# Patient Record
Sex: Male | Born: 1965 | Race: White | Hispanic: No | Marital: Married | Smoking: Current every day smoker
Health system: Southern US, Community
[De-identification: ages and names within clinical notes are randomized; demographics above are authoritative.]

## PROBLEM LIST (undated history)

## (undated) DIAGNOSIS — T7840XA Allergy, unspecified, initial encounter: Secondary | ICD-10-CM

## (undated) DIAGNOSIS — E785 Hyperlipidemia, unspecified: Secondary | ICD-10-CM

## (undated) DIAGNOSIS — Z5189 Encounter for other specified aftercare: Secondary | ICD-10-CM

## (undated) DIAGNOSIS — K222 Esophageal obstruction: Secondary | ICD-10-CM

## (undated) DIAGNOSIS — K2270 Barrett's esophagus without dysplasia: Secondary | ICD-10-CM

## (undated) DIAGNOSIS — K635 Polyp of colon: Secondary | ICD-10-CM

## (undated) DIAGNOSIS — K5790 Diverticulosis of intestine, part unspecified, without perforation or abscess without bleeding: Secondary | ICD-10-CM

## (undated) DIAGNOSIS — K219 Gastro-esophageal reflux disease without esophagitis: Secondary | ICD-10-CM

## (undated) DIAGNOSIS — M48061 Spinal stenosis, lumbar region without neurogenic claudication: Secondary | ICD-10-CM

## (undated) DIAGNOSIS — Z8719 Personal history of other diseases of the digestive system: Secondary | ICD-10-CM

## (undated) HISTORY — DX: Spinal stenosis, lumbar region without neurogenic claudication: M48.061

## (undated) HISTORY — DX: Hyperlipidemia, unspecified: E78.5

## (undated) HISTORY — DX: Allergy, unspecified, initial encounter: T78.40XA

## (undated) HISTORY — PX: TONSILLECTOMY AND ADENOIDECTOMY: SUR1326

## (undated) HISTORY — DX: Esophageal obstruction: K22.2

## (undated) HISTORY — PX: WISDOM TOOTH EXTRACTION: SHX21

## (undated) HISTORY — DX: Diverticulosis of intestine, part unspecified, without perforation or abscess without bleeding: K57.90

## (undated) HISTORY — DX: Personal history of other diseases of the digestive system: Z87.19

## (undated) HISTORY — DX: Gastro-esophageal reflux disease without esophagitis: K21.9

## (undated) HISTORY — DX: Encounter for other specified aftercare: Z51.89

## (undated) HISTORY — DX: Polyp of colon: K63.5

## (undated) HISTORY — PX: ANKLE FRACTURE SURGERY: SHX122

## (undated) HISTORY — DX: Barrett's esophagus without dysplasia: K22.70

---

## 1993-11-13 HISTORY — PX: SPINE SURGERY: SHX786

## 1993-11-13 HISTORY — PX: CERVICAL SPINE SURGERY: SHX589

## 2002-11-13 DIAGNOSIS — Z5189 Encounter for other specified aftercare: Secondary | ICD-10-CM

## 2002-11-13 HISTORY — DX: Encounter for other specified aftercare: Z51.89

## 2002-11-13 HISTORY — PX: LUMBAR FUSION: SHX111

## 2002-11-13 HISTORY — PX: SPINE SURGERY: SHX786

## 2013-12-11 ENCOUNTER — Ambulatory Visit (INDEPENDENT_AMBULATORY_CARE_PROVIDER_SITE_OTHER): Payer: BC Managed Care – PPO | Admitting: Physical Therapy

## 2013-12-11 DIAGNOSIS — M6281 Muscle weakness (generalized): Secondary | ICD-10-CM

## 2013-12-11 DIAGNOSIS — L679 Hair color and hair shaft abnormality, unspecified: Secondary | ICD-10-CM

## 2013-12-15 ENCOUNTER — Encounter (INDEPENDENT_AMBULATORY_CARE_PROVIDER_SITE_OTHER): Payer: BC Managed Care – PPO | Admitting: Physical Therapy

## 2013-12-15 DIAGNOSIS — M545 Low back pain, unspecified: Secondary | ICD-10-CM

## 2013-12-15 DIAGNOSIS — M6281 Muscle weakness (generalized): Secondary | ICD-10-CM

## 2013-12-17 ENCOUNTER — Encounter (INDEPENDENT_AMBULATORY_CARE_PROVIDER_SITE_OTHER): Payer: Self-pay

## 2013-12-17 ENCOUNTER — Encounter (INDEPENDENT_AMBULATORY_CARE_PROVIDER_SITE_OTHER): Payer: BC Managed Care – PPO | Admitting: Physical Therapy

## 2013-12-17 DIAGNOSIS — M545 Low back pain, unspecified: Secondary | ICD-10-CM

## 2013-12-17 DIAGNOSIS — M6281 Muscle weakness (generalized): Secondary | ICD-10-CM

## 2013-12-22 ENCOUNTER — Encounter (INDEPENDENT_AMBULATORY_CARE_PROVIDER_SITE_OTHER): Payer: BC Managed Care – PPO | Admitting: Physical Therapy

## 2013-12-22 DIAGNOSIS — M545 Low back pain, unspecified: Secondary | ICD-10-CM

## 2013-12-22 DIAGNOSIS — M6281 Muscle weakness (generalized): Secondary | ICD-10-CM

## 2013-12-24 ENCOUNTER — Encounter (INDEPENDENT_AMBULATORY_CARE_PROVIDER_SITE_OTHER): Payer: BC Managed Care – PPO | Admitting: Physical Therapy

## 2013-12-24 DIAGNOSIS — M545 Low back pain, unspecified: Secondary | ICD-10-CM

## 2013-12-24 DIAGNOSIS — M47817 Spondylosis without myelopathy or radiculopathy, lumbosacral region: Secondary | ICD-10-CM

## 2013-12-24 DIAGNOSIS — M6281 Muscle weakness (generalized): Secondary | ICD-10-CM

## 2013-12-24 DIAGNOSIS — M51379 Other intervertebral disc degeneration, lumbosacral region without mention of lumbar back pain or lower extremity pain: Secondary | ICD-10-CM

## 2013-12-24 DIAGNOSIS — M5137 Other intervertebral disc degeneration, lumbosacral region: Secondary | ICD-10-CM

## 2013-12-29 ENCOUNTER — Encounter (INDEPENDENT_AMBULATORY_CARE_PROVIDER_SITE_OTHER): Payer: BC Managed Care – PPO | Admitting: Physical Therapy

## 2013-12-29 DIAGNOSIS — M6281 Muscle weakness (generalized): Secondary | ICD-10-CM

## 2013-12-29 DIAGNOSIS — M47817 Spondylosis without myelopathy or radiculopathy, lumbosacral region: Secondary | ICD-10-CM

## 2013-12-29 DIAGNOSIS — M5137 Other intervertebral disc degeneration, lumbosacral region: Secondary | ICD-10-CM

## 2013-12-29 DIAGNOSIS — M545 Low back pain, unspecified: Secondary | ICD-10-CM

## 2013-12-29 DIAGNOSIS — M51379 Other intervertebral disc degeneration, lumbosacral region without mention of lumbar back pain or lower extremity pain: Secondary | ICD-10-CM

## 2013-12-31 ENCOUNTER — Encounter: Payer: BC Managed Care – PPO | Admitting: Physical Therapy

## 2016-02-03 ENCOUNTER — Ambulatory Visit (INDEPENDENT_AMBULATORY_CARE_PROVIDER_SITE_OTHER): Payer: BLUE CROSS/BLUE SHIELD | Admitting: Family Medicine

## 2016-02-03 ENCOUNTER — Encounter: Payer: Self-pay | Admitting: Family Medicine

## 2016-02-03 VITALS — BP 125/75 | HR 82 | Temp 97.4°F | Ht 72.0 in | Wt 233.2 lb

## 2016-02-03 DIAGNOSIS — Z1211 Encounter for screening for malignant neoplasm of colon: Secondary | ICD-10-CM

## 2016-02-03 DIAGNOSIS — Z0000 Encounter for general adult medical examination without abnormal findings: Secondary | ICD-10-CM

## 2016-02-03 DIAGNOSIS — M19071 Primary osteoarthritis, right ankle and foot: Secondary | ICD-10-CM

## 2016-02-03 MED ORDER — MELOXICAM 15 MG PO TABS: 15.0000 mg | ORAL_TABLET | Freq: Every day | ORAL | Status: DC

## 2016-02-03 NOTE — Progress Notes (Signed)
BP 125/75 mmHg  Pulse 82  Temp(Src) 97.4 F (36.3 C) (Oral)  Ht 6' (1.829 m)  Wt 233 lb 3.2 oz (105.779 kg)  BMI 31.62 kg/m2   Subjective:    Patient ID: Nathaniel Mills, male    DOB: [birth year], 50 y.o.   MRN: [id number]  HPI: Nathaniel Mills is a 50 y.o. male presenting on 02/03/2016 for Annual Exam   HPI Well adult exam Patient is coming in today for well adult exam. Besides a little bit of constipation and his chronic ankle issues from an accident he has no other major issues. He denies any chest pain, shortness of breath, headaches or vision issues, abdominal complaints, diarrhea, nausea, vomiting, or other joint issues.   Arthritis of right ankle Patient is coming in as well for arthritis of the right ankle but he has had intermittently over the years. The arthritis is been persistent and he takes Advil and Tylenol intermittently and wants to know if there is something that he can take instead of having to take multiple ibuprofen and Advil throughout the day. He had a major accident when he was younger and had surgery to repair fracture in that ankle and gets the pain intermittently when he is up on his feet for prolonged periods of time.  Relevant past medical, surgical, family and social history reviewed and updated as indicated. Interim medical history since our last visit reviewed. Allergies and medications reviewed and updated.  Review of Systems  Constitutional: Negative for fever.  HENT: Negative for ear discharge and ear pain.   Eyes: Negative for discharge and visual disturbance.  Respiratory: Negative for shortness of breath and wheezing.   Cardiovascular: Negative for chest pain and leg swelling.  Gastrointestinal: Negative for abdominal pain, diarrhea and constipation.  Genitourinary: Negative for difficulty urinating.  Musculoskeletal: Positive for arthralgias. Negative for back pain, joint swelling and gait problem.  Skin: Negative for rash.  Neurological:  Negative for syncope, light-headedness and headaches.  All other systems reviewed and are negative.   Per HPI unless specifically indicated above  Social History   Social History  . Marital Status: Married    Spouse Name: N/A  . Number of Children: N/A  . Years of Education: N/A   Occupational History  . Not on file.   Social History Main Topics  . Smoking status: Current Every Day Smoker -- 1.00 packs/day for 35 years    Types: Cigars  . Smokeless tobacco: Never Used  . Alcohol Use: 3.6 oz/week    6 Cans of beer per week  . Drug Use: No  . Sexual Activity: Yes   Other Topics Concern  . Not on file   Social History Narrative  . No narrative on file    Past Surgical History  Procedure Laterality Date  . Ankle fracture surgery Left 2009 & 2011  . Spine surgery  2004    L4-6, fusion  . Spine surgery  1995    c2 facture, halo fixation    Family History  Problem Relation Age of Onset  . Heart disease Mother   . Hyperlipidemia Mother   . Hypertension Mother   . Arthritis Mother   . Alcohol abuse Mother   . Arthritis Father   . COPD Father   . Alcohol abuse Father   . Alcohol abuse Sister   . Alcohol abuse Brother   . Alcohol abuse Sister       Medication List       This list  is accurate as of: 02/03/16  4:10 PM.  Always use your most recent med list.               ibuprofen 800 MG tablet  Commonly known as:  ADVIL,MOTRIN  Take 800 mg by mouth daily with breakfast.     meloxicam 15 MG tablet  Commonly known as:  MOBIC  Take 1 tablet (15 mg total) by mouth daily.           Objective:    BP 125/75 mmHg  Pulse 82  Temp(Src) 97.4 F (36.3 C) (Oral)  Ht 6' (1.829 m)  Wt 233 lb 3.2 oz (105.779 kg)  BMI 31.62 kg/m2  Wt Readings from Last 3 Encounters:  02/03/16 233 lb 3.2 oz (105.779 kg)    Physical Exam  Constitutional: He is oriented to person, place, and time. He appears well-developed and well-nourished. No distress.  Eyes:  Conjunctivae and EOM are normal. Pupils are equal, round, and reactive to light. Right eye exhibits no discharge. No scleral icterus.  Neck: Neck supple. No thyromegaly present.  Cardiovascular: Normal rate, regular rhythm, normal heart sounds and intact distal pulses.   No murmur heard. Pulmonary/Chest: Effort normal and breath sounds normal. No respiratory distress. He has no wheezes.  Musculoskeletal: Normal range of motion. He exhibits no edema.       Right ankle: He exhibits normal range of motion, no swelling, no ecchymosis and no deformity. No tenderness (No tenderness to palpation today on exam.). Achilles tendon normal.  Lymphadenopathy:    He has no cervical adenopathy.  Neurological: He is alert and oriented to person, place, and time. Coordination normal.  Skin: Skin is warm and dry. No rash noted. He is not diaphoretic.  Psychiatric: He has a normal mood and affect. His behavior is normal.  Vitals reviewed.  No results found for this or any previous visit.    Assessment & Plan:   Problem List Items Addressed This Visit    None    Visit Diagnoses    Well adult exam    -  Primary    Relevant Orders    CMP14+EGFR (Completed)    Lipid panel (Completed)    HIV antibody (Completed)    PSA, total and free (Completed)    Arthritis of right ankle        Relevant Medications    meloxicam (MOBIC) 15 MG tablet    Screen for colon cancer        Relevant Orders    Ambulatory referral to Gastroenterology       Follow up plan: Return in about 1 year (around 02/02/2017), or if symptoms worsen or fail to improve.  Caryl Pina, MD [facility] 02/03/2016, 4:10 PM

## 2016-02-05 ENCOUNTER — Other Ambulatory Visit: Payer: BLUE CROSS/BLUE SHIELD

## 2016-02-05 DIAGNOSIS — Z0000 Encounter for general adult medical examination without abnormal findings: Secondary | ICD-10-CM

## 2016-02-06 LAB — CMP14+EGFR
A/G RATIO: 2 (ref 1.2–2.2)
ALK PHOS: 94 IU/L (ref 39–117)
ALT: 19 IU/L (ref 0–44)
AST: 18 IU/L (ref 0–40)
Albumin: 4.5 g/dL (ref 3.5–5.5)
BILIRUBIN TOTAL: 0.5 mg/dL (ref 0.0–1.2)
BUN/Creatinine Ratio: 12 (ref 9–20)
BUN: 10 mg/dL (ref 6–24)
CHLORIDE: 104 mmol/L (ref 96–106)
CO2: 23 mmol/L (ref 18–29)
Calcium: 9.2 mg/dL (ref 8.7–10.2)
Creatinine, Ser: 0.85 mg/dL (ref 0.76–1.27)
GFR calc Af Amer: 117 mL/min/{1.73_m2} (ref >59)
GFR calc non Af Amer: 102 mL/min/{1.73_m2} (ref >59)
Globulin, Total: 2.3 g/dL (ref 1.5–4.5)
Glucose: 94 mg/dL (ref 65–99)
POTASSIUM: 4.7 mmol/L (ref 3.5–5.2)
Sodium: 143 mmol/L (ref 134–144)
Total Protein: 6.8 g/dL (ref 6.0–8.5)

## 2016-02-06 LAB — PSA, TOTAL AND FREE
PSA FREE PCT: 53 %
PSA, Free: 0.53 ng/mL
Prostate Specific Ag, Serum: 1 ng/mL (ref 0.0–4.0)

## 2016-02-06 LAB — HIV ANTIBODY (ROUTINE TESTING W REFLEX): HIV SCREEN 4TH GENERATION: NONREACTIVE

## 2016-02-06 LAB — LIPID PANEL
CHOLESTEROL TOTAL: 167 mg/dL (ref 100–199)
Chol/HDL Ratio: 3.9 ratio units (ref 0.0–5.0)
HDL: 43 mg/dL (ref >39)
LDL Calculated: 110 mg/dL — ABNORMAL HIGH (ref 0–99)
TRIGLYCERIDES: 68 mg/dL (ref 0–149)
VLDL Cholesterol Cal: 14 mg/dL (ref 5–40)

## 2016-02-10 ENCOUNTER — Encounter: Payer: Self-pay | Admitting: *Deleted

## 2016-05-15 ENCOUNTER — Encounter: Payer: Self-pay | Admitting: Gastroenterology

## 2016-06-16 ENCOUNTER — Other Ambulatory Visit: Payer: Self-pay | Admitting: Family Medicine

## 2016-06-16 DIAGNOSIS — M19071 Primary osteoarthritis, right ankle and foot: Secondary | ICD-10-CM

## 2016-07-22 ENCOUNTER — Other Ambulatory Visit: Payer: Self-pay | Admitting: Family Medicine

## 2016-07-22 DIAGNOSIS — M19071 Primary osteoarthritis, right ankle and foot: Secondary | ICD-10-CM

## 2016-09-06 ENCOUNTER — Ambulatory Visit (INDEPENDENT_AMBULATORY_CARE_PROVIDER_SITE_OTHER): Payer: BLUE CROSS/BLUE SHIELD | Admitting: Family

## 2016-09-06 VITALS — BP 119/71 | HR 82 | Temp 97.6°F | Ht 72.0 in | Wt 233.8 lb

## 2016-09-06 DIAGNOSIS — J209 Acute bronchitis, unspecified: Secondary | ICD-10-CM | POA: Diagnosis not present

## 2016-09-06 DIAGNOSIS — F17200 Nicotine dependence, unspecified, uncomplicated: Secondary | ICD-10-CM

## 2016-09-06 MED ORDER — PREDNISONE 10 MG (21) PO TBPK: ORAL_TABLET | ORAL | 0 refills | Status: DC

## 2016-09-06 MED ORDER — AZITHROMYCIN 250 MG PO TABS: ORAL_TABLET | ORAL | 0 refills | Status: DC

## 2016-09-06 NOTE — Patient Instructions (Addendum)

## 2016-09-06 NOTE — Progress Notes (Signed)
Subjective:    Patient ID: Nathaniel Mills, male    DOB: 07-Sep-1966, 50 y.o.   MRN: [id number]  URI   This is a new problem. The current episode started 1 to 4 weeks ago. Associated symptoms include congestion, coughing, rhinorrhea and wheezing. Pertinent negatives include no ear pain, headaches or sore throat.  Cough  This is a new problem. The current episode started 1 to 4 weeks ago. The problem has been unchanged. The problem occurs every few minutes. The cough is productive of sputum and productive of purulent sputum. Associated symptoms include ear congestion, nasal congestion, postnasal drip, rhinorrhea and wheezing. Pertinent negatives include no chills, ear pain, headaches, myalgias, sore throat or shortness of breath. The symptoms are aggravated by lying down. Risk factors for lung disease include smoking/tobacco exposure. He has tried rest and OTC cough suppressant for the symptoms. The treatment provided moderate relief. There is no history of asthma or COPD.      Review of Systems  Constitutional: Negative for chills.  HENT: Positive for congestion, postnasal drip and rhinorrhea. Negative for ear pain and sore throat.   Respiratory: Positive for cough and wheezing. Negative for shortness of breath.   Musculoskeletal: Negative for myalgias.  Neurological: Negative for headaches.  All other systems reviewed and are negative.      Objective:   Physical Exam  Constitutional: He is oriented to person, place, and time. He appears well-developed and well-nourished. No distress.  HENT:  Head: Normocephalic.  Right Ear: External ear normal.  Left Ear: External ear normal.  Nose: Mucosal edema and rhinorrhea present. Right sinus exhibits maxillary sinus tenderness. Left sinus exhibits maxillary sinus tenderness.  Mouth/Throat: Posterior oropharyngeal erythema present.  Eyes: Pupils are equal, round, and reactive to light. Right eye exhibits no discharge. Left eye exhibits no  discharge.  Neck: Normal range of motion. Neck supple. No thyromegaly present.  Cardiovascular: Normal rate, regular rhythm, normal heart sounds and intact distal pulses.   No murmur heard. Pulmonary/Chest: Effort normal. No respiratory distress. He has no wheezes. He has rhonchi in the right lower field and the left lower field.  Abdominal: Soft. Bowel sounds are normal. He exhibits no distension. There is no tenderness.  Musculoskeletal: Normal range of motion. He exhibits no edema or tenderness.  Neurological: He is alert and oriented to person, place, and time. He has normal reflexes. No cranial nerve deficit.  Skin: Skin is warm and dry. No rash noted. No erythema.  Psychiatric: He has a normal mood and affect. His behavior is normal. Judgment and thought content normal.  Vitals reviewed.     BP 119/71 (BP Location: Left Arm, Patient Position: Sitting, Cuff Size: Large)   Pulse 82   Temp 97.6 F (36.4 C) (Oral)   Ht 6' (1.829 m)   Wt 233 lb 12.8 oz (106.1 kg)   BMI 31.71 kg/m      Assessment & Plan:  1. Acute bronchitis, unspecified organism -- Take meds as prescribed - Use a cool mist humidifier  -Use saline nose sprays frequently -Saline irrigations of the nose can be very helpful if done frequently.  * 4X daily for 1 week*  * Use of a nettie pot can be helpful with this. Follow directions with this* -Force fluids -For any cough or congestion  Use plain Mucinex- regular strength or max strength is fine   * Children- consult with Pharmacist for dosing -For fever or aces or pains- take tylenol or ibuprofen appropriate for age and  weight.  * for fevers greater than 101 orally you may alternate ibuprofen and tylenol every  3 hours. -Throat lozenges if help - azithromycin (ZITHROMAX) 250 MG tablet; Take 500 mg once, then 250 mg for four days  Dispense: 6 tablet; Refill: 0 - predniSONE (STERAPRED UNI-PAK 21 TAB) 10 MG (21) TBPK tablet; Use as directed  Dispense: 21 tablet;  Refill: 0  2. Current smoker - azithromycin (ZITHROMAX) 250 MG tablet; Take 500 mg once, then 250 mg for four days  Dispense: 6 tablet; Refill: 0  Evelina Dun, FNP

## 2016-09-12 ENCOUNTER — Ambulatory Visit (INDEPENDENT_AMBULATORY_CARE_PROVIDER_SITE_OTHER): Payer: BLUE CROSS/BLUE SHIELD | Admitting: Nurse Practitioner

## 2016-09-12 ENCOUNTER — Encounter: Payer: Self-pay | Admitting: Nurse Practitioner

## 2016-09-12 VITALS — BP 128/78 | HR 84 | Temp 97.0°F | Ht 72.0 in | Wt 236.0 lb

## 2016-09-12 DIAGNOSIS — M5441 Lumbago with sciatica, right side: Secondary | ICD-10-CM | POA: Diagnosis not present

## 2016-09-12 DIAGNOSIS — M5442 Lumbago with sciatica, left side: Secondary | ICD-10-CM

## 2016-09-12 DIAGNOSIS — G8929 Other chronic pain: Secondary | ICD-10-CM | POA: Diagnosis not present

## 2016-09-12 NOTE — Progress Notes (Signed)
   Subjective:    Patient ID: Nathaniel Mills, male    DOB: May 10, 1966, 50 y.o.   MRN: [id number]  HPI  Patient in today saying back pain is no better. He was seen 09/06/16 and was diagnosed with  Bronchitis and was given steroid shot and zithromax. He says he was c/o back pain that day but there is nothing in the chart about his back pain. He said he had spinal fusion 12 years ago and he has frequent bouts of pain with numbness and tingling down legs. Rates pain 5/10 currently. Pain is intermittent and movement increases pain. He denies any recent injuries or strains. Says he is starting to loose strength in his legs.    Review of Systems  Constitutional: Negative.   HENT: Negative.   Respiratory: Negative.   Gastrointestinal: Negative.   Musculoskeletal: Positive for back pain and gait problem.  Psychiatric/Behavioral: Negative.   All other systems reviewed and are negative.      Objective:   Physical Exam  Constitutional: He is oriented to person, place, and time. He appears well-developed and well-nourished. No distress.  HENT:  Head: Normocephalic.  Cardiovascular: Normal rate, regular rhythm and normal heart sounds.   Pulmonary/Chest: Effort normal and breath sounds normal.  Musculoskeletal:  FROM of lumbar spine painful with rotation in either direction and full flexion. (-SLR ) Motor strength and sensation distally intact and equal bil Pain in lower back on palpation.  Neurological: He is alert and oriented to person, place, and time. He has normal reflexes.  Skin: Skin is warm and dry.  Psychiatric: He has a normal mood and affect. His behavior is normal. Judgment and thought content normal.    BP 128/78   Pulse 84   Temp 97 F (36.1 C) (Oral)   Ht 6' (1.829 m)   Wt 236 lb (107 kg)   BMI 32.01 kg/m       Assessment & Plan:  1. Chronic midline low back pain with bilateral sciatica Moist heat Rest mobic Back strengthing exercises- until sees dr. Nelva Bush -  Ambulatory referral to [place], [place]

## 2016-09-12 NOTE — Patient Instructions (Signed)

## 2016-10-09 ENCOUNTER — Other Ambulatory Visit: Payer: Self-pay | Admitting: Orthopedic Surgery

## 2016-10-09 DIAGNOSIS — G8929 Other chronic pain: Secondary | ICD-10-CM

## 2016-10-09 DIAGNOSIS — M5442 Lumbago with sciatica, left side: Principal | ICD-10-CM

## 2016-10-27 ENCOUNTER — Ambulatory Visit
Admission: RE | Admit: 2016-10-27 | Discharge: 2016-10-27 | Disposition: A | Discharge status: Home / Self Care | Payer: BLUE CROSS/BLUE SHIELD | Source: Ambulatory Visit | Attending: Orthopedic Surgery | Admitting: Orthopedic Surgery

## 2016-10-27 DIAGNOSIS — M5442 Lumbago with sciatica, left side: Principal | ICD-10-CM

## 2016-10-27 DIAGNOSIS — G8929 Other chronic pain: Secondary | ICD-10-CM

## 2016-10-27 MED ORDER — DIAZEPAM 5 MG PO TABS
10.0000 mg | ORAL_TABLET | Freq: Once | ORAL | Status: AC
  Administered 2016-10-27: 10 mg via ORAL

## 2016-10-27 MED ORDER — ONDANSETRON HCL 4 MG/2ML IJ SOLN: 4.0000 mg | Freq: Four times a day (QID) | INTRAMUSCULAR | Status: DC | PRN

## 2016-10-27 MED ORDER — IOPAMIDOL (ISOVUE-M 200) INJECTION 41%
15.0000 mL | Freq: Once | INTRAMUSCULAR | Status: AC
  Administered 2016-10-27: 15 mL via INTRATHECAL

## 2016-10-27 NOTE — Progress Notes (Signed)
Patient states he has been off Tramadol for at least the past two days.  jkl 

## 2016-10-27 NOTE — Discharge Instructions (Signed)
Myelogram Discharge Instructions  1. Go home and rest quietly for the next 24 hours.  It is important to lie flat for the next 24 hours.  Get up only to go to the restroom.  You may lie in the bed or on a couch on your back, your stomach, your left side or your right side.  You may have one pillow under your head.  You may have pillows between your knees while you are on your side or under your knees while you are on your back.  2. DO NOT drive today.  Recline the seat as far back as it will go, while still wearing your seat belt, on the way home.  3. You may get up to go to the bathroom as needed.  You may sit up for 10 minutes to eat.  You may resume your normal diet and medications unless otherwise indicated.  Drink lots of extra fluids today and tomorrow.  4. The incidence of headache, nausea, or vomiting is about 5% (one in 20 patients).  If you develop a headache, lie flat and drink plenty of fluids until the headache goes away.  Caffeinated beverages may be helpful.  If you develop severe nausea and vomiting or a headache that does not go away with flat bed rest, call [phone].  5. You may resume normal activities after your 24 hours of bed rest is over; however, do not exert yourself strongly or do any heavy lifting tomorrow. If when you get up you have a headache when standing, go back to bed and force fluids for another 24 hours.  6. Call your physician for a follow-up appointment.  The results of your myelogram will be sent directly to your physician by the following day.  7. If you have any questions or if complications develop after you arrive home, please call [phone].  Discharge instructions have been explained to the patient.  The patient, or the person responsible for the patient, fully understands these instructions.       May resume Tramadol on Dec. 16, 2017, after 10:30 am.

## 2016-10-30 ENCOUNTER — Telehealth: Payer: Self-pay

## 2016-10-30 NOTE — Telephone Encounter (Signed)
Spoke with patient's wife after he had a myelogram here 10/27/16.  She states he is doing fine and did not get a headache.  jkl

## 2016-11-15 ENCOUNTER — Ambulatory Visit: Payer: BLUE CROSS/BLUE SHIELD | Attending: Orthopedic Surgery | Admitting: Physical Therapy

## 2016-11-15 ENCOUNTER — Encounter: Payer: Self-pay | Admitting: Physical Therapy

## 2016-11-15 DIAGNOSIS — G8929 Other chronic pain: Secondary | ICD-10-CM | POA: Diagnosis present

## 2016-11-15 DIAGNOSIS — M5441 Lumbago with sciatica, right side: Secondary | ICD-10-CM | POA: Diagnosis present

## 2016-11-15 DIAGNOSIS — M6281 Muscle weakness (generalized): Secondary | ICD-10-CM

## 2016-11-15 DIAGNOSIS — M5442 Lumbago with sciatica, left side: Secondary | ICD-10-CM | POA: Insufficient documentation

## 2016-11-15 NOTE — Therapy (Signed)
[facility] [place], [place], [address] Phone: [phone]   Fax:  [phone]  Physical Therapy Evaluation  Patient Details  Name: Nathaniel Mills MRN: [id number] Date of Birth: June 14, 1966 Referring Provider: Melina Schools, MD  Encounter Date: 11/15/2016      PT End of Session - 11/15/16 [id number]    Visit Number 1   Number of Visits 12   Date for PT Re-Evaluation 12/27/16   PT Start Time 0823   PT Stop Time 0921   PT Time Calculation (min) 58 min   Activity Tolerance Patient tolerated treatment well   Behavior During Therapy [facility] for tasks assessed/performed      Past Medical History:  Diagnosis Date  . Allergy   . Arthritis   . Blood transfusion without reported diagnosis 2004  . Depression 2004  . GERD (gastroesophageal reflux disease) 2004-2012  . Hyperlipidemia     Past Surgical History:  Procedure Laterality Date  . ANKLE FRACTURE SURGERY Left 2009 & 2011  . SPINE SURGERY  2004   L4-6, fusion  . SPINE SURGERY  1995   c2 facture, halo fixation    There were no vitals filed for this visit.       Subjective Assessment - 11/15/16 0824    Subjective Patient reports he did really well for about two years after surgery in 2004. Ever since then he has had B leg pain and weakness. Weakness is worse in left. He has difficulty with inclines and stairs. He only has 3 stairs at home.   Pertinent History L4-5 fusion   Patient Stated Goals decrease pain and improve strength   Currently in Pain? Yes   Pain Score 6    Pain Location Back   Pain Orientation Left;Right  L > R   Pain Descriptors / Indicators Burning;Sharp   Pain Type Chronic pain   Pain Radiating Towards posterior thighs to knees L>R   Pain Onset More than a month ago   Pain Frequency Constant   Aggravating Factors  slouching, sleeping wrong?, lack of lumbar support   Pain Relieving Factors poor body mechanics   Effect of Pain on Daily Activities  limited            [facility] PT Assessment - 11/15/16 0001      Assessment   Medical Diagnosis chronic B LBP with B sciatica   Referring Provider Melina Schools, MD   Onset Date/Surgical Date 11/13/01     Precautions   Precaution Comments L4-5 fusion     Balance Screen   Has the patient fallen in the past 6 months No   Has the patient had a decrease in activity level because of a fear of falling?  Yes   Is the patient reluctant to leave their home because of a fear of falling?  No     Home Environment   Additional Comments one level home     Prior Function   Vocation Full time employment   Vocation Requirements sitting at desk, walking on concrete floor     Posture/Postural Control   Posture Comments depressed left shoulder and scapula; tight L QL     ROM / Strength   AROM / PROM / Strength AROM;Strength     AROM   Overall AROM Comments Lumbar flex <90 deg; RSB decreased 25%; L rotation decreased 25%     Strength   Overall Strength Comments RLE 5/5 except R knee flex and hip ABD 4-/5; L hip flex  4/5, knee flex 4-/5,      Flexibility   Soft Tissue Assessment /Muscle Length yes   Hamstrings R approx 50 deg; L 60 deg   Quadriceps B tightness; L HF tightness   Piriformis mod tightness B     Palpation   Palpation comment B lumbar paraspinals, L QL                    [facility] Adult PT Treatment/Exercise - 11/15/16 0001      Modalities   Modalities Electrical Stimulation;Moist Heat     Moist Heat Therapy   Number Minutes Moist Heat 15 Minutes   Moist Heat Location Lumbar Spine     Electrical Stimulation   Electrical Stimulation Location premod to lumbar 80-150Hz  x 15 min   Electrical Stimulation Goals Pain                PT Education - 11/15/16 1216    Education provided Yes   Education Details HEP   Person(s) Educated Patient   Methods Explanation;Demonstration;Handout   Comprehension Returned demonstration;Verbalized understanding              PT Long Term Goals - 11/15/16 1218      PT LONG TERM GOAL #1   Title I with HEP   Time 6   Period Weeks   Status New     PT LONG TERM GOAL #2   Title Decreased pain with ADLS by [id number]   Time 6   Period Weeks   Status New     PT LONG TERM GOAL #3   Title Improved BLE strength to 5/5.   Time 6   Period Weeks   Status New     PT LONG TERM GOAL #4   Title Patient to be able to climb stairs recipocally with UE support for balance only   Time 6   Period Weeks   Status New               Plan - 11/15/16 0910    Clinical Impression Statement Patient presents with long h/o LBP due to stenosis. He has limited ROM, impaired flexibility and weakness all affecting ADLs including stair climbing.    Rehab Potential Good   PT Frequency 2x / week   PT Duration 8 weeks   PT Treatment/Interventions ADLs/Self Care Home Management;Electrical Stimulation;Cryotherapy;Moist Heat;Ultrasound;Therapeutic exercise;Patient/family education;Manual techniques;Dry needling   PT Next Visit Plan Flexibility, core strengthening, functional strengthening (stairs, squats, lunges)   PT Home Exercise Plan stretches: hip flexor, HS, quads, piriformis, calf   Consulted and Agree with Plan of Care Patient      Patient will benefit from skilled therapeutic intervention in order to improve the following deficits and impairments:  Decreased range of motion, Pain, Impaired flexibility, Decreased strength  Visit Diagnosis: Chronic bilateral low back pain with bilateral sciatica - Plan: PT plan of care cert/re-cert  Muscle weakness (generalized) - Plan: PT plan of care cert/re-cert     Problem List There are no active problems to display for this patient.   Madelyn Flavors PT 11/15/2016, 12:25 PM  [facility] [place], [place], [address] Phone: [phone]   Fax:  [phone]  Name: Detric Shaheen MRN: [id number] Date of Birth:  04-13-1966

## 2016-11-15 NOTE — Patient Instructions (Signed)
  Hamstring Stretch, Reclined (Strap, Doorframe)   Lengthen bottom leg on floor. Extend top leg along edge of doorframe or press foot up into yoga strap. Hold for 60 seconds. Repeat 3_ times each leg.  Quadriceps (Prone)   On stomach with sheet around ankles, knees together, hips down, pull heels toward bottom. Keep hips flat. Hold __60__ seconds. Repeat _3__ times. Do __3__ sessions per day. CAUTION: Stretch should be gentle, steady and slow.   Achilles Tendon Stretch   Stand with hands supported on wall, elbows slightly bent, feet parallel and both heels on floor, front knee bent, back knee straight. Slowly relax back knee until a stretch is felt in achilles tendon. Hold __30_ seconds. Repeat with leg positions switched. Repeat with back knee slightly bent.  Hip Stretch  Put right ankle over left knee. Let right knee fall downward, but keep ankle in place. Feel the stretch in hip. May push down gently with hand to feel stretch. Hold _60___ seconds while counting out loud. Repeat with other leg. Repeat __3__ times. Do ___3_ sessions per day.  Stretching: Piriformis (Supine)  Pull right knee toward opposite shoulder. Hold 60____ seconds. Relax. Repeat ____ times per set. Do ____ sets per session. Do ____ sessions per day.   Leg Extension (Hamstring)   Sit toward front edge of chair, with leg out straight, heel on floor, toes pointing toward body. Keeping back straight, bend forward at hip, until a stretch is felt. Hold 30-60 seconds. Repeat _3__ times. Repeat with other leg. Do __2-3_ sessions per day.  Nathaniel Mills, PT 11/15/16 8:57 AM [facility] [address] [place], [place], [address] Phone: [phone]   Fax:  [phone]

## 2016-11-16 ENCOUNTER — Ambulatory Visit: Payer: BLUE CROSS/BLUE SHIELD | Admitting: Physical Therapy

## 2016-11-21 ENCOUNTER — Encounter: Payer: BLUE CROSS/BLUE SHIELD | Admitting: Physical Therapy

## 2016-11-23 ENCOUNTER — Encounter: Payer: BLUE CROSS/BLUE SHIELD | Admitting: Physical Therapy

## 2016-11-30 ENCOUNTER — Encounter: Payer: BLUE CROSS/BLUE SHIELD | Admitting: Physical Therapy

## 2016-12-01 ENCOUNTER — Telehealth: Payer: Self-pay | Admitting: Family Medicine

## 2016-12-01 NOTE — Telephone Encounter (Signed)
Wife states that they get this From Dr Rolena Infante - ortho - not here

## 2016-12-01 NOTE — Telephone Encounter (Signed)
I have only seen him once and that was March 2017 so if he wants to come in to discuss something come in for a visit.

## 2016-12-05 ENCOUNTER — Ambulatory Visit: Payer: BLUE CROSS/BLUE SHIELD | Admitting: Physical Therapy

## 2016-12-05 ENCOUNTER — Encounter: Payer: Self-pay | Admitting: Physical Therapy

## 2016-12-05 DIAGNOSIS — G8929 Other chronic pain: Secondary | ICD-10-CM

## 2016-12-05 DIAGNOSIS — M5442 Lumbago with sciatica, left side: Principal | ICD-10-CM

## 2016-12-05 DIAGNOSIS — M5441 Lumbago with sciatica, right side: Principal | ICD-10-CM

## 2016-12-05 DIAGNOSIS — M6281 Muscle weakness (generalized): Secondary | ICD-10-CM

## 2016-12-05 NOTE — Patient Instructions (Addendum)
Pelvic Tilt    Flatten back by tightening stomach muscles and buttocks. Hold for 5 seconds each. Repeat __10__ times per set. Do _2___ sets per session. Do __2-3__ sessions per day.  http://orth.exer.us/134   Copyright  VHI. All rights reserved.  Bent Leg Lift (Hook-Lying)    Tighten stomach and slowly raise right/left leg __3__ inches from floor. Keep trunk rigid.  Repeat __10__ times per set. Do __2__ sets per session. Do __2-3__ sessions per day.  http://orth.exer.us/1090   Copyright  VHI. All rights reserved.  Bridging    Slowly raise buttocks from floor, keeping stomach tight. Repeat __10__ times per set. Do _2___ sets per session. Do _2-3___ sessions per day.  http://orth.exer.us/1096   Copyright  VHI. All rights reserved.  Straight Leg Raise    Tighten stomach and slowly raise locked right/left leg _5___ inches from floor. Repeat __10__ times per set. Do __2__ sets per session. Do __2-3__ sessions per day.  http://orth.exer.us/1102   Copyright  VHI. All rights reserved.

## 2016-12-05 NOTE — Therapy (Signed)
[facility] [place], [place], [address] Phone: [phone]   Fax:  [phone]  Physical Therapy Treatment  Patient Details  Name: Nathaniel Mills MRN: [id number] Date of Birth: 11-Jul-1966 Referring Provider: Melina Schools, MD  Encounter Date: 12/05/2016      PT End of Session - 12/05/16 1659    Visit Number 2   Number of Visits 12   Date for PT Re-Evaluation 12/27/16   PT Start Time [id number]   PT Stop Time 1731   PT Time Calculation (min) 44 min   Activity Tolerance Patient tolerated treatment well   Behavior During Therapy [facility] for tasks assessed/performed      Past Medical History:  Diagnosis Date  . Allergy   . Arthritis   . Blood transfusion without reported diagnosis 2004  . Depression 2004  . GERD (gastroesophageal reflux disease) 2004-2012  . Hyperlipidemia     Past Surgical History:  Procedure Laterality Date  . ANKLE FRACTURE SURGERY Left 2009 & 2011  . SPINE SURGERY  2004   L4-6, fusion  . SPINE SURGERY  1995   c2 facture, halo fixation    There were no vitals filed for this visit.      Subjective Assessment - 12/05/16 1647    Subjective Reports that he is behind as he had a death in the family right after evaluation. Reports more stiffness than pain and weakness in LE. Reports that he was unable    Pertinent History L4-5 fusion   Patient Stated Goals decrease pain and improve strength   Currently in Pain? Yes   Pain Score 2    Pain Location Back   Pain Orientation Lower   Pain Descriptors / Indicators Other (Comment)  Stiffness   Pain Type Chronic pain   Pain Onset More than a month ago   Aggravating Factors  Slouching, sleeping wrong, lack of lumbar support   Pain Relieving Factors Changing positions            [facility] PT Assessment - 12/05/16 0001      Assessment   Medical Diagnosis chronic B LBP with B sciatica   Onset Date/Surgical Date 11/13/01   Next MD Visit None scheduled     Precautions   Precaution Comments L4-5 fusion                     [facility] Adult PT Treatment/Exercise - 12/05/16 0001      Exercises   Exercises Lumbar     Lumbar Exercises: Stretches   Active Hamstring Stretch 3 reps;30 seconds   Active Hamstring Stretch Limitations BLE   Single Knee to Chest Stretch 3 reps;30 seconds   Single Knee to Chest Stretch Limitations BLE   Piriformis Stretch 3 reps;30 seconds   Piriformis Stretch Limitations BLE     Lumbar Exercises: Supine   Ab Set 20 reps;5 seconds   Clam 20 reps   Clam Limitations Red theraband   Heel Slides 15 reps   Heel Slides Limitations BLE   Bent Knee Raise 20 reps   Bent Knee Raise Limitations BLE   Bridge 15 reps   Straight Leg Raise 15 reps   Straight Leg Raises Limitations BLE     Modalities   Modalities Electrical Stimulation;Moist Heat     Moist Heat Therapy   Number Minutes Moist Heat 15 Minutes   Moist Heat Location Lumbar Spine     Electrical Stimulation   Electrical Stimulation Location B lumbar paraspinals  Electrical Stimulation Action Pre-Mod   Electrical Stimulation Parameters 80-150 hz x15 min   Electrical Stimulation Goals Pain                PT Education - 12/05/16 1725    Education provided Yes   Education Details HEP- ab set, march, bridge, [employer]   Person(s) Educated Patient   Methods Explanation;Verbal cues;Handout   Comprehension Verbalized understanding;Verbal cues required             PT Long Term Goals - 11/15/16 1218      PT LONG TERM GOAL #1   Title I with HEP   Time 6   Period Weeks   Status New     PT LONG TERM GOAL #2   Title Decreased pain with ADLS by [id number]   Time 6   Period Weeks   Status New     PT LONG TERM GOAL #3   Title Improved BLE strength to 5/5.   Time 6   Period Weeks   Status New     PT LONG TERM GOAL #4   Title Patient to be able to climb stairs recipocally with UE support for balance only   Time 6   Period Weeks    Status New               Plan - 12/05/16 1727    Clinical Impression Statement Patient presented in clinic with reports of low back stiffness and report of noncompliance with initial HEP secondary to being out of town due to death in the family. Patient very compliant with instruction and exercises today and able to complete with no reports of any increased pain with core strengthening exercises. Patient experienced more tightness and weakness in LLE per patient report. Patient provided new core/LE strengthening HEP and encouraged to add with stretching HEP provided at evaluation. Normal modalities response noted following removal of the modalities. Patient noted low back feeling "good" upon sitting after exercises.   Rehab Potential Good   PT Frequency 2x / week   PT Duration 8 weeks   PT Treatment/Interventions ADLs/Self Care Home Management;Electrical Stimulation;Cryotherapy;Moist Heat;Ultrasound;Therapeutic exercise;Patient/family education;Manual techniques;Dry needling   PT Next Visit Plan Flexibility, core strengthening, functional strengthening (stairs, squats, lunges)   PT Home Exercise Plan stretches: hip flexor, HS, quads, piriformis, calf; ab set, marching, bridge, SLR   Consulted and Agree with Plan of Care Patient      Patient will benefit from skilled therapeutic intervention in order to improve the following deficits and impairments:  Decreased range of motion, Pain, Impaired flexibility, Decreased strength  Visit Diagnosis: Chronic bilateral low back pain with bilateral sciatica  Muscle weakness (generalized)     Problem List There are no active problems to display for this patient.   Wynelle Fanny, PTA 12/05/2016, 5:40 PM  [facility] [address] [place], [place], [address] Phone: [phone]   Fax:  [phone]  Name: Nathaniel Mills MRN: [id number] Date of Birth: 1966-10-28

## 2016-12-07 ENCOUNTER — Ambulatory Visit: Payer: BLUE CROSS/BLUE SHIELD | Admitting: Physical Therapy

## 2016-12-07 ENCOUNTER — Encounter: Payer: Self-pay | Admitting: Physical Therapy

## 2016-12-07 DIAGNOSIS — G8929 Other chronic pain: Secondary | ICD-10-CM

## 2016-12-07 DIAGNOSIS — M5441 Lumbago with sciatica, right side: Principal | ICD-10-CM

## 2016-12-07 DIAGNOSIS — M5442 Lumbago with sciatica, left side: Secondary | ICD-10-CM | POA: Diagnosis not present

## 2016-12-07 DIAGNOSIS — M6281 Muscle weakness (generalized): Secondary | ICD-10-CM

## 2016-12-07 NOTE — Therapy (Signed)
[facility] [place], [place], [address] Phone: [phone]   Fax:  [phone]  Physical Therapy Treatment  Patient Details  Name: Nathaniel Mills MRN: [id number] Date of Birth: Dec 14, 1965 Referring Provider: Melina Schools, MD  Encounter Date: 12/07/2016      PT End of Session - 12/07/16 1653    Visit Number 3   Number of Visits 12   Date for PT Re-Evaluation 12/27/16   PT Start Time [id number]   PT Stop Time 1739   PT Time Calculation (min) 51 min   Activity Tolerance Patient tolerated treatment well   Behavior During Therapy [facility] for tasks assessed/performed      Past Medical History:  Diagnosis Date  . Allergy   . Arthritis   . Blood transfusion without reported diagnosis 2004  . Depression 2004  . GERD (gastroesophageal reflux disease) 2004-2012  . Hyperlipidemia     Past Surgical History:  Procedure Laterality Date  . ANKLE FRACTURE SURGERY Left 2009 & 2011  . SPINE SURGERY  2004   L4-6, fusion  . SPINE SURGERY  1995   c2 facture, halo fixation    There were no vitals filed for this visit.      Subjective Assessment - 12/07/16 1651    Subjective Reports that he is having a good day with not a lot of pain and pep in his step. Continues to have some stiffness in low back.   Pertinent History L4-5 fusion   Patient Stated Goals decrease pain and improve strength   Currently in Pain? Yes   Pain Score 3    Pain Location Back   Pain Orientation Lower   Pain Descriptors / Indicators Other (Comment)  "stiffness"   Pain Type Chronic pain   Pain Onset More than a month ago            [facility] PT Assessment - 12/07/16 0001      Assessment   Medical Diagnosis chronic B LBP with B sciatica   Onset Date/Surgical Date 11/13/01   Next MD Visit None scheduled     Precautions   Precaution Comments L4-5 fusion                     [facility] Adult PT Treatment/Exercise - 12/07/16 0001      Lumbar  Exercises: Stretches   Active Hamstring Stretch 3 reps;30 seconds   Active Hamstring Stretch Limitations BLE with opp LE extension as to stretch hip flexor as well   Single Knee to Chest Stretch 3 reps;30 seconds   Single Knee to Chest Stretch Limitations BLE   Piriformis Stretch 3 reps;30 seconds   Piriformis Stretch Limitations BLE     Lumbar Exercises: Machines for Strengthening   Cybex Knee Extension 10# 3x10 reps  with core activation   Cybex Knee Flexion 30# 3x10 reps  with core activation     Lumbar Exercises: Standing   Other Standing Lumbar Exercises B forward step ups 6" x20 reps each with one UE support     Lumbar Exercises: Supine   Ab Set 20 reps;5 seconds   Clam 20 reps   Clam Limitations Red theraband   Bent Knee Raise 20 reps   Bent Knee Raise Limitations BLE   Bridge 20 reps;3 seconds   Straight Leg Raise 20 reps   Straight Leg Raises Limitations BLE   Other Supine Lumbar Exercises B leg press with green theraband and core activation x20 reps each   Other  Supine Lumbar Exercises Dead bug x15 reps     Modalities   Modalities Electrical Stimulation;Moist Heat     Moist Heat Therapy   Number Minutes Moist Heat 15 Minutes   Moist Heat Location Lumbar Spine     Electrical Stimulation   Electrical Stimulation Location B lumbar paraspinals   Electrical Stimulation Action Pre-Mod   Electrical Stimulation Parameters 80-150 hz x15 min   Electrical Stimulation Goals Pain                     PT Long Term Goals - 12/07/16 1654      PT LONG TERM GOAL #1   Title I with HEP   Time 6   Period Weeks   Status Achieved     PT LONG TERM GOAL #2   Title Decreased pain with ADLS by [id number]   Time 6   Period Weeks   Status On-going     PT LONG TERM GOAL #3   Title Improved BLE strength to 5/5.   Time 6   Period Weeks   Status On-going     PT LONG TERM GOAL #4   Title Patient to be able to climb stairs recipocally with UE support for balance only    Time 6   Period Weeks   Status On-going               Plan - 12/07/16 1742    Clinical Impression Statement Patient tolerated today's treatment very well and arrived with low level low back stiffness. Patient reports compliance with HEP at home. Patient able to complete core/LE strengthening exercises with no reports of any increased pain. Patient experienced increased tightness in L HS today when compared to RLE. Standing and machine LE strengthening completed today with patient experiencing greater weakness in LLE. Normal modalities response noted following removal of the modalities.   Rehab Potential Good   PT Frequency 2x / week   PT Duration 8 weeks   PT Treatment/Interventions ADLs/Self Care Home Management;Electrical Stimulation;Cryotherapy;Moist Heat;Ultrasound;Therapeutic exercise;Patient/family education;Manual techniques;Dry needling   PT Next Visit Plan Flexibility, core strengthening, functional strengthening (stairs, squats, lunges)   PT Home Exercise Plan stretches: hip flexor, HS, quads, piriformis, calf; ab set, marching, bridge, SLR   Consulted and Agree with Plan of Care Patient      Patient will benefit from skilled therapeutic intervention in order to improve the following deficits and impairments:  Decreased range of motion, Pain, Impaired flexibility, Decreased strength  Visit Diagnosis: Chronic bilateral low back pain with bilateral sciatica  Muscle weakness (generalized)     Problem List There are no active problems to display for this patient.   Wynelle Fanny, PTA 12/07/2016, 5:49 PM  [facility] [address] [place], [place], [address] Phone: [phone]   Fax:  [phone]  Name: Nathaniel Mills MRN: [id number] Date of Birth: [birth year]

## 2016-12-12 ENCOUNTER — Encounter: Payer: BLUE CROSS/BLUE SHIELD | Admitting: Physical Therapy

## 2016-12-12 ENCOUNTER — Ambulatory Visit: Payer: BLUE CROSS/BLUE SHIELD | Admitting: Physical Therapy

## 2016-12-12 DIAGNOSIS — M5442 Lumbago with sciatica, left side: Secondary | ICD-10-CM | POA: Diagnosis not present

## 2016-12-12 DIAGNOSIS — G8929 Other chronic pain: Secondary | ICD-10-CM

## 2016-12-12 DIAGNOSIS — M5441 Lumbago with sciatica, right side: Principal | ICD-10-CM

## 2016-12-12 NOTE — Therapy (Signed)
[facility] [place], [place], [address] Phone: [phone]   Fax:  [phone]  Physical Therapy Treatment  Patient Details  Name: Nathaniel Mills MRN: [id number] Date of Birth: 02-25-1966 Referring Provider: Melina Schools, MD  Encounter Date: 12/12/2016      PT End of Session - 12/12/16 0818    Visit Number 4   Number of Visits 12   Date for PT Re-Evaluation 12/27/16   PT Start Time 0818   PT Stop Time 0920   PT Time Calculation (min) 62 min      Past Medical History:  Diagnosis Date  . Allergy   . Arthritis   . Blood transfusion without reported diagnosis 2004  . Depression 2004  . GERD (gastroesophageal reflux disease) 2004-2012  . Hyperlipidemia     Past Surgical History:  Procedure Laterality Date  . ANKLE FRACTURE SURGERY Left 2009 & 2011  . SPINE SURGERY  2004   L4-6, fusion  . SPINE SURGERY  1995   c2 facture, halo fixation    There were no vitals filed for this visit.      Subjective Assessment - 12/12/16 0818    Subjective Patient presents with TENS unit today for help setting it up. Overall patient reports improved mobility and decreased pain.    Pertinent History L4-5 fusion   Patient Stated Goals decrease pain and improve strength   Currently in Pain? Yes   Pain Score 2    Pain Location Back   Pain Orientation Lower   Pain Descriptors / Indicators --  stiffness   Pain Type Chronic pain   Pain Radiating Towards posterior thighs  to knees L >R intermittent   Pain Onset More than a month ago   Pain Frequency Constant   Aggravating Factors  stretching, poor mechanics   Pain Relieving Factors changing positios                         [facility] Adult PT Treatment/Exercise - 12/12/16 0001      Self-Care   Self-Care Other Self-Care Comments   Other Self-Care Comments  Educated patient in use of home TENS unit including precautions and contraindications; set up and removal;  patient independent in donning/doffing and use.     Modalities   Modalities Electrical Stimulation     Moist Heat Therapy   Number Minutes Moist Heat 15 Minutes   Moist Heat Location Lumbar Spine     Electrical Stimulation   Electrical Stimulation Location B lumbar/gluteals   Electrical Stimulation Action Premod   Electrical Stimulation Parameters 80-150hz  mod x 15 min   Electrical Stimulation Goals Pain     Manual Therapy   Manual Therapy Soft tissue mobilization   Soft tissue mobilization to B gluteals/piriformis and lumbar paraspinals          Trigger Point Dry Needling - 12/12/16 1014    Consent Given? Yes   Education Handout Provided Yes   Muscles Treated Upper Body Quadratus Lumborum  B   Muscles Treated Lower Body Gluteus minimus;Gluteus maximus;Piriformis  B; and L3-5 multifidi   Gluteus Maximus Response Twitch response elicited;Palpable increased muscle length   Gluteus Minimus Response Twitch response elicited;Palpable increased muscle length   Piriformis Response Twitch response elicited;Palpable increased muscle length                   PT Long Term Goals - 12/12/16 [id number]      PT LONG TERM GOAL #  1   Title I with HEP   Time 6   Period Weeks   Status Achieved     PT LONG TERM GOAL #2   Title Decreased pain with ADLS by [id number]   Time 6   Period Weeks   Status Achieved     PT LONG TERM GOAL #3   Title Improved BLE strength to 5/5.   Time 6   Period Weeks   Status On-going     PT LONG TERM GOAL #4   Title Patient to be able to climb stairs recipocally with UE support for balance only   Time 6   Period Weeks   Status On-going               Plan - 12/12/16 1018    Clinical Impression Statement Patient reports 50% improvement overall in pain and reports increased mobility and function. He continues to have pain in his low back L>R and intermitent referral into B posterior thighs. He responded well to DN with localized twitch responses  elicited L >R. Additionally, patient was educated in use of his home TENS unit and is I with its use.   Rehab Potential Good   PT Frequency 2x / week   PT Duration 8 weeks   PT Treatment/Interventions ADLs/Self Care Home Management;Electrical Stimulation;Cryotherapy;Moist Heat;Ultrasound;Therapeutic exercise;Patient/family education;Manual techniques;Dry needling   PT Next Visit Plan Assess DN; continue as indicated. Flexibility, core strengthening, functional strengthening (stairs, squats, lunges)   PT Home Exercise Plan stretches: hip flexor, HS, quads, piriformis, calf; ab set, marching, bridge, SLR   Consulted and Agree with Plan of Care Patient      Patient will benefit from skilled therapeutic intervention in order to improve the following deficits and impairments:  Decreased range of motion, Pain, Impaired flexibility, Decreased strength  Visit Diagnosis: Chronic bilateral low back pain with bilateral sciatica     Problem List There are no active problems to display for this patient.  Madelyn Flavors PT 12/12/2016, 10:32 AM  [facility] [address] [place], [place], [address] Phone: [phone]   Fax:  [phone]  Name: Nathaniel Mills MRN: [id number] Date of Birth: 07/16/1966

## 2016-12-12 NOTE — Patient Instructions (Signed)
Trigger Point Dry Needling  . What is Trigger Point Dry Needling (DN)? o DN is a physical therapy technique used to treat muscle pain and dysfunction. Specifically, DN helps deactivate muscle trigger points (muscle knots).  o A thin filiform needle is used to penetrate the skin and stimulate the underlying trigger point. The goal is for a local twitch response (LTR) to occur and for the trigger point to relax. No medication of any kind is injected during the procedure.   . What Does Trigger Point Dry Needling Feel Like?  o The procedure feels different for each individual patient. Some patients report that they do not actually feel the needle enter the skin and overall the process is not painful. Very mild bleeding may occur. However, many patients feel a deep cramping in the muscle in which the needle was inserted. This is the local twitch response.   Marland Kitchen How Will I feel after the treatment? o Soreness is normal, and the onset of soreness may not occur for a few hours. Typically this soreness does not last longer than two days.  o Bruising is uncommon, however; ice can be used to decrease any possible bruising.  o In rare cases feeling tired or nauseous after the treatment is normal. In addition, your symptoms may get worse before they get better, this period will typically not last longer than 24 hours.   . What Can I do After My Treatment? o Increase your hydration by drinking more water for the next 24 hours. o You may place ice or heat on the areas treated that have become sore, however, do not use heat on inflamed or bruised areas. Heat often brings more relief post needling. o You can continue your regular activities, but vigorous activity is not recommended initially after the treatment for 24 hours. o DN is best combined with other physical therapy such as strengthening, stretching, and other therapies.    Precautions:  In some cases, dry needling is done over the lung field. While rare,  there is a risk of pneumothorax (punctured lung). Because of this, if you ever experience shortness of breath on exertion, difficulty taking a deep breath, chest pain or a dry cough following dry needling, you should report to an emergency room and tell them that you have been dry needled over the thorax.  Madelyn Flavors, PT 12/12/16 8:26 AM [facility] [address] [place], [place], [address] Phone: [phone]   Fax:  [phone]

## 2016-12-14 ENCOUNTER — Encounter: Payer: BLUE CROSS/BLUE SHIELD | Admitting: Physical Therapy

## 2016-12-19 ENCOUNTER — Encounter: Payer: BLUE CROSS/BLUE SHIELD | Admitting: Physical Therapy

## 2016-12-26 ENCOUNTER — Ambulatory Visit: Payer: BLUE CROSS/BLUE SHIELD | Attending: Orthopedic Surgery | Admitting: *Deleted

## 2016-12-26 DIAGNOSIS — M6281 Muscle weakness (generalized): Secondary | ICD-10-CM | POA: Diagnosis present

## 2016-12-26 DIAGNOSIS — G8929 Other chronic pain: Secondary | ICD-10-CM

## 2016-12-26 DIAGNOSIS — M5442 Lumbago with sciatica, left side: Secondary | ICD-10-CM | POA: Diagnosis present

## 2016-12-26 DIAGNOSIS — M5441 Lumbago with sciatica, right side: Secondary | ICD-10-CM | POA: Diagnosis present

## 2016-12-26 NOTE — Therapy (Addendum)
[facility] [place], [place], [address] Phone: [phone]   Fax:  [phone]  Physical Therapy Treatment  Patient Details  Name: Nathaniel Mills MRN: [id number] Date of Birth: Oct 22, 1966 Referring Provider: Melina Schools, MD  Encounter Date: 12/26/2016      PT End of Session - 12/26/16 1651    Visit Number 5   Number of Visits 12   Date for PT Re-Evaluation 12/27/16   PT Start Time 8563   PT Stop Time 1497   PT Time Calculation (min) 37 min      Past Medical History:  Diagnosis Date  . Allergy   . Arthritis   . Blood transfusion without reported diagnosis 2004  . Depression 2004  . GERD (gastroesophageal reflux disease) 2004-2012  . Hyperlipidemia     Past Surgical History:  Procedure Laterality Date  . ANKLE FRACTURE SURGERY Left 2009 & 2011  . SPINE SURGERY  2004   L4-6, fusion  . SPINE SURGERY  1995   c2 facture, halo fixation    There were no vitals filed for this visit.      Subjective Assessment - 12/26/16 1648    Subjective Patient presents with TENS unit today for help setting it up. Overall patient reports improved mobility and decreased pain.  2/10 Be put on hold  after today   Pertinent History L4-5 fusion   Patient Stated Goals decrease pain and improve strength   Currently in Pain? Yes   Pain Score 2    Pain Location Back   Pain Orientation Lower   Pain Type Chronic pain   Pain Onset More than a month ago   Pain Frequency Constant                         [facility] Adult PT Treatment/Exercise - 12/26/16 0001      Lumbar Exercises: Stretches   Active Hamstring Stretch 5 reps;30 seconds   Single Knee to Chest Stretch 5 reps;30 seconds   Single Knee to Chest Stretch Limitations BLE   Piriformis Stretch 30 seconds;5 reps     Lumbar Exercises: Seated   Sit to Stand 10 reps     Lumbar Exercises: Supine   Ab Set 20 reps;5 seconds  Drawin   Bent Knee Raise 20 reps   Bridge 20 reps;3 seconds                     PT Long Term Goals - 12/26/16 1725      PT LONG TERM GOAL #1   Title I with HEP   Time 6   Period Weeks   Status Achieved     PT LONG TERM GOAL #2   Title Decreased pain with ADLS by 02%   Time 6   Period Weeks   Status Achieved     PT LONG TERM GOAL #3   Title Improved BLE strength to 5/5.   Time 6   Period Weeks   Status Achieved     PT LONG TERM GOAL #4   Title Patient to be able to climb stairs recipocally with UE support for balance only   Period Weeks   Status Achieved               Plan - 12/26/16 1738    Clinical Impression Statement Pt did great today and wants to be put on hold at this time. HEP was reviewed and core activation exs were  added. He has MET LTGs at this point, but is worried about a relapse.   Rehab Potential Good   PT Frequency 2x / week   PT Duration 8 weeks   PT Treatment/Interventions ADLs/Self Care Home Management;Electrical Stimulation;Cryotherapy;Moist Heat;Ultrasound;Therapeutic exercise;Patient/family education;Manual techniques;Dry needling   PT Next Visit Plan Assess DN; continue as indicated. Flexibility, core strengthening, functional strengthening (stairs, squats, lunges)    ON HOLD x 1-2weeks. as per Pt.   PT Home Exercise Plan stretches: hip flexor, HS, quads, piriformis, calf; ab set, marching, bridge, SLR   Consulted and Agree with Plan of Care Patient      Patient will benefit from skilled therapeutic intervention in order to improve the following deficits and impairments:  Decreased range of motion, Pain, Impaired flexibility, Decreased strength  Visit Diagnosis: Chronic bilateral low back pain with bilateral sciatica  Muscle weakness (generalized)     Problem List There are no active problems to display for this patient.   RAMSEUR,CHRIS, PTA 12/26/2016, 5:43 PM  [facility] [place],  [place], [address] Phone: [phone]   Fax:  [phone]  Name: Nathaniel Mills MRN: [id number] Date of Birth: 20-May-1966  PHYSICAL THERAPY DISCHARGE SUMMARY  Visits from Start of Care: 5.  Current functional level related to goals / functional outcomes: See above.   Remaining deficits: All goals met.   Education / Equipment: HEP. Plan: Patient agrees to discharge.  Patient goals were met. Patient is being discharged due to meeting the stated rehab goals.  ?????         Mali Applegate MPT

## 2016-12-30 ENCOUNTER — Other Ambulatory Visit: Payer: Self-pay | Admitting: Family Medicine

## 2016-12-30 DIAGNOSIS — M19071 Primary osteoarthritis, right ankle and foot: Secondary | ICD-10-CM

## 2017-01-28 ENCOUNTER — Other Ambulatory Visit: Payer: Self-pay | Admitting: Family Medicine

## 2017-01-28 DIAGNOSIS — M19071 Primary osteoarthritis, right ankle and foot: Secondary | ICD-10-CM

## 2017-03-03 ENCOUNTER — Other Ambulatory Visit: Payer: Self-pay | Admitting: Family Medicine

## 2017-03-03 DIAGNOSIS — M19071 Primary osteoarthritis, right ankle and foot: Secondary | ICD-10-CM

## 2017-04-05 ENCOUNTER — Other Ambulatory Visit: Payer: Self-pay | Admitting: Nurse Practitioner

## 2017-04-05 DIAGNOSIS — M19071 Primary osteoarthritis, right ankle and foot: Secondary | ICD-10-CM

## 2017-07-03 ENCOUNTER — Encounter: Payer: Self-pay | Admitting: Family Medicine

## 2017-07-03 ENCOUNTER — Ambulatory Visit (INDEPENDENT_AMBULATORY_CARE_PROVIDER_SITE_OTHER): Payer: Managed Care, Other (non HMO) | Admitting: Family Medicine

## 2017-07-03 VITALS — BP 135/83 | HR 85 | Temp 97.5°F | Ht 72.0 in | Wt 236.4 lb

## 2017-07-03 DIAGNOSIS — R5383 Other fatigue: Secondary | ICD-10-CM

## 2017-07-03 DIAGNOSIS — R0989 Other specified symptoms and signs involving the circulatory and respiratory systems: Secondary | ICD-10-CM

## 2017-07-03 DIAGNOSIS — Z1211 Encounter for screening for malignant neoplasm of colon: Secondary | ICD-10-CM

## 2017-07-03 DIAGNOSIS — Z0000 Encounter for general adult medical examination without abnormal findings: Secondary | ICD-10-CM | POA: Diagnosis not present

## 2017-07-03 DIAGNOSIS — R198 Other specified symptoms and signs involving the digestive system and abdomen: Secondary | ICD-10-CM

## 2017-07-03 MED ORDER — OMEPRAZOLE 20 MG PO CPDR: 20.0000 mg | DELAYED_RELEASE_CAPSULE | Freq: Every day | ORAL | 3 refills | Status: DC

## 2017-07-03 NOTE — Progress Notes (Signed)
BP 135/83   Pulse 85   Temp (!) 97.5 F (36.4 C) (Oral)   Ht 6' (1.829 m)   Wt 236 lb 6 oz (107.2 kg)   BMI 32.06 kg/m    Subjective:    Patient ID: Nathaniel Mills, male    DOB: 1966-03-30, 51 y.o.   MRN: [id number]  HPI: Nathaniel Mills is a 51 y.o. male presenting on 07/03/2017 for Annual Exam   HPI Adult well exam Patient comes in today for a well adult exam and labs. He says overall he is doing well except he has decreased energy and globus sensation in his throat where food gets stuck and intermittent diarrhea and constipation. He says the intermittent diarrhea and constipation is been going on for quite some time and he was scheduled see gastroenterology, has not had a chance to get any of the procedures that they recommended due to cost but will look into going to see them again. He says his decreased energy has been more of an issue over the past 3 months and he feels like he had a wall. He does feel like he sleeps well and sleeps at least 7 hours a night and does not wake up tired but throughout the day if he sits down and he will fall asleep very quickly. The globus sensation where he feels like food gets stuck in his throat has been going on for about a year and he has seen a gastroenterologist for this but he has not had the EGD that they recommended yet. He is not currently on any medications for it. He denies any heartburn or acid symptoms. He otherwise denies any major health issues.  Relevant past medical, surgical, family and social history reviewed and updated as indicated. Interim medical history since our last visit reviewed. Allergies and medications reviewed and updated.  Review of Systems  Constitutional: Positive for fatigue. Negative for chills and fever.  HENT: Negative for ear pain and tinnitus.   Eyes: Negative for pain and discharge.  Respiratory: Negative for cough, shortness of breath and wheezing.   Cardiovascular: Negative for chest pain, palpitations and  leg swelling.  Gastrointestinal: Positive for constipation and diarrhea. Negative for abdominal pain and blood in stool.  Genitourinary: Negative for dysuria and hematuria.  Musculoskeletal: Negative for back pain, gait problem and myalgias.  Skin: Negative for rash.  Neurological: Negative for dizziness, weakness and headaches.  Psychiatric/Behavioral: Negative for suicidal ideas.  All other systems reviewed and are negative.   Per HPI unless specifically indicated above        Objective:    BP 135/83   Pulse 85   Temp (!) 97.5 F (36.4 C) (Oral)   Ht 6' (1.829 m)   Wt 236 lb 6 oz (107.2 kg)   BMI 32.06 kg/m   Wt Readings from Last 3 Encounters:  07/03/17 236 lb 6 oz (107.2 kg)  09/12/16 236 lb (107 kg)  09/06/16 233 lb 12.8 oz (106.1 kg)    Physical Exam  Constitutional: He is oriented to person, place, and time. He appears well-developed and well-nourished. No distress.  HENT:  Right Ear: External ear normal.  Left Ear: External ear normal.  Nose: Nose normal.  Mouth/Throat: Oropharynx is clear and moist. No oropharyngeal exudate.  Eyes: Pupils are equal, round, and reactive to light. Conjunctivae and EOM are normal. No scleral icterus.  Neck: Neck supple. No thyromegaly present.  Cardiovascular: Normal rate, regular rhythm, normal heart sounds and intact distal pulses.  No murmur heard. Pulmonary/Chest: Effort normal and breath sounds normal. No respiratory distress. He has no wheezes. He has no rales.  Abdominal: Soft. Bowel sounds are normal. He exhibits no distension. There is no tenderness. There is no rebound and no guarding.  Musculoskeletal: Normal range of motion. He exhibits no edema.  Lymphadenopathy:    He has no cervical adenopathy.  Neurological: He is alert and oriented to person, place, and time. Coordination normal.  Skin: Skin is warm and dry. No rash noted. He is not diaphoretic.  Psychiatric: He has a normal mood and affect. His behavior is  normal.  Nursing note and vitals reviewed.     Assessment & Plan:   Problem List Items Addressed This Visit    None    Visit Diagnoses    Well adult exam    -  Primary   Relevant Orders   CMP14+EGFR   Lipid panel   Colon cancer screening       Relevant Orders   Ambulatory referral to Gastroenterology   Globus sensation       Relevant Medications   omeprazole (PRILOSEC) 20 MG capsule   Other Relevant Orders   Ambulatory referral to Gastroenterology   Other fatigue       Consider sleep study in the future, we'll go see gastroenterology and to lab testing   Relevant Orders   CMP14+EGFR   CBC with Differential/Platelet   Thyroid Panel With TSH       Follow up plan: Return in about 1 year (around 07/03/2018), or if symptoms worsen or fail to improve.  Counseling provided for all of the vaccine components Orders Placed This Encounter  Procedures  . CMP14+EGFR  . Lipid panel  . CBC with Differential/Platelet  . Thyroid Panel With TSH  . Ambulatory referral to Gastroenterology    Caryl Pina, MD [facility] 07/03/2017, 4:11 PM

## 2017-07-04 LAB — LIPID PANEL
CHOLESTEROL TOTAL: 177 mg/dL (ref 100–199)
Chol/HDL Ratio: 5.1 ratio — ABNORMAL HIGH (ref 0.0–5.0)
HDL: 35 mg/dL — ABNORMAL LOW (ref >39)
LDL Calculated: 86 mg/dL (ref 0–99)
Triglycerides: 282 mg/dL — ABNORMAL HIGH (ref 0–149)
VLDL Cholesterol Cal: 56 mg/dL — ABNORMAL HIGH (ref 5–40)

## 2017-07-04 LAB — CMP14+EGFR
ALK PHOS: 83 IU/L (ref 39–117)
ALT: 34 IU/L (ref 0–44)
AST: 29 IU/L (ref 0–40)
Albumin/Globulin Ratio: 2.2 (ref 1.2–2.2)
Albumin: 4.7 g/dL (ref 3.5–5.5)
BUN / CREAT RATIO: 20 (ref 9–20)
BUN: 17 mg/dL (ref 6–24)
Bilirubin Total: 0.6 mg/dL (ref 0.0–1.2)
CALCIUM: 9.5 mg/dL (ref 8.7–10.2)
CHLORIDE: 103 mmol/L (ref 96–106)
CO2: 25 mmol/L (ref 20–29)
Creatinine, Ser: 0.85 mg/dL (ref 0.76–1.27)
GFR calc Af Amer: 116 mL/min/{1.73_m2} (ref >59)
GFR calc non Af Amer: 100 mL/min/{1.73_m2} (ref >59)
GLUCOSE: 90 mg/dL (ref 65–99)
Globulin, Total: 2.1 g/dL (ref 1.5–4.5)
POTASSIUM: 4.9 mmol/L (ref 3.5–5.2)
SODIUM: 142 mmol/L (ref 134–144)
Total Protein: 6.8 g/dL (ref 6.0–8.5)

## 2017-07-04 LAB — CBC WITH DIFFERENTIAL/PLATELET
BASOS: 1 %
Basophils Absolute: 0.1 10*3/uL (ref 0.0–0.2)
EOS (ABSOLUTE): 0.2 10*3/uL (ref 0.0–0.4)
EOS: 2 %
HEMOGLOBIN: 16 g/dL (ref 13.0–17.7)
Hematocrit: 46.5 % (ref 37.5–51.0)
IMMATURE GRANS (ABS): 0.1 10*3/uL (ref 0.0–0.1)
Immature Granulocytes: 1 %
LYMPHS: 34 %
Lymphocytes Absolute: 2.8 10*3/uL (ref 0.7–3.1)
MCH: 32.9 pg (ref 26.6–33.0)
MCHC: 34.4 g/dL (ref 31.5–35.7)
MCV: 96 fL (ref 79–97)
MONOCYTES: 8 %
Monocytes Absolute: 0.6 10*3/uL (ref 0.1–0.9)
NEUTROS PCT: 54 %
Neutrophils Absolute: 4.7 10*3/uL (ref 1.4–7.0)
PLATELETS: 236 10*3/uL (ref 150–379)
RBC: 4.86 x10E6/uL (ref 4.14–5.80)
RDW: 13.6 % (ref 12.3–15.4)
WBC: 8.4 10*3/uL (ref 3.4–10.8)

## 2017-07-04 LAB — THYROID PANEL WITH TSH
FREE THYROXINE INDEX: 1.7 (ref 1.2–4.9)
T3 UPTAKE RATIO: 26 % (ref 24–39)
T4, Total: 6.7 ug/dL (ref 4.5–12.0)
TSH: 2.85 u[IU]/mL (ref 0.450–4.500)

## 2017-08-13 ENCOUNTER — Encounter: Payer: Self-pay | Admitting: Gastroenterology

## 2017-08-21 ENCOUNTER — Telehealth: Payer: Self-pay

## 2017-08-21 ENCOUNTER — Ambulatory Visit (AMBULATORY_SURGERY_CENTER): Payer: Self-pay

## 2017-08-21 VITALS — Ht 71.0 in | Wt 239.4 lb

## 2017-08-21 DIAGNOSIS — Z1211 Encounter for screening for malignant neoplasm of colon: Secondary | ICD-10-CM

## 2017-08-21 MED ORDER — SUPREP BOWEL PREP KIT 17.5-3.13-1.6 GM/177ML PO SOLN: 1.0000 | Freq: Once | ORAL | 0 refills | Status: AC

## 2017-08-21 NOTE — Progress Notes (Signed)
No allergies to eggs or soy No diet meds No past problems with anesthesia Except difficult airway mentioned when he had his L4-5-spine surgery 2004   No home oxygen  Registered emmi

## 2017-08-21 NOTE — Telephone Encounter (Signed)
John and HD,   OV first vs direct [facility] pt...@PV  pt reported difficulty during intubation/L4-5 surgery 2004 (15 yrs ago). Please advise.  Saleh Ulbrich/[facility]

## 2017-08-22 ENCOUNTER — Encounter: Payer: Self-pay | Admitting: Gastroenterology

## 2017-08-22 NOTE — Telephone Encounter (Signed)
Called both numbers listed, no answer. Left message for patient to call me back before 5 pm.

## 2017-08-22 NOTE — Telephone Encounter (Signed)
Your call, Nathaniel Mills healthy.  - HD

## 2017-08-22 NOTE — Telephone Encounter (Signed)
Levada Dy,  Unless he can produce an anesthetic record from that surgery for [place] to review, he will have go to [facility].  Even then, he might still have to go if, in fact, is a difficult intubation.  Thanks,  Osvaldo Angst

## 2017-08-23 NOTE — Telephone Encounter (Signed)
Attempted to call patient at his home no answer.  Called his mobile and talked to patient.  I explained that Dr. Loletha Carrow is wanting him to be scheduled at [facility] due to the difficult Airway.  I also advised patient that he needs to get the records from where he had his surgery and problems with his airway.  [place] and was very difficult to get the records in the past.  I advised him to attempt to get them if he can.  I sent Almyra Free a staff message to call patient and schedule the colon at [facility].  [place] appointment was canceled.  Patient was okay with this.    Elizabeth Palau, CMA [facility]

## 2017-08-31 ENCOUNTER — Telehealth: Payer: Self-pay | Admitting: Gastroenterology

## 2017-08-31 ENCOUNTER — Other Ambulatory Visit: Payer: Self-pay

## 2017-08-31 ENCOUNTER — Telehealth: Payer: Self-pay

## 2017-08-31 DIAGNOSIS — Z1211 Encounter for screening for malignant neoplasm of colon: Secondary | ICD-10-CM

## 2017-08-31 MED ORDER — NA SULFATE-K SULFATE-MG SULF 17.5-3.13-1.6 GM/177ML PO SOLN: 1.0000 | Freq: Once | ORAL | 0 refills | Status: AC

## 2017-08-31 NOTE — Telephone Encounter (Signed)
-----   Message from Ronelle Nigh, [place] sent at 08/23/2017  7:43 AM EDT ----- Regarding: reschedule Colonoscopy at [facility] Please schedule patient at [facility] due to difficult airway.  See note in chart. Appt. Was canceled here at [facility].  Patient is aware you will be contacting him to schedule.

## 2017-08-31 NOTE — Telephone Encounter (Signed)
Patient scheduled for screening colonoscopy at [facility] on 10/15/17 08:30. I have left patient a message to contact our office and I will let him know where and when this is scheduled. I have mailed out prep instructions and sent in Rx for Suprep to his pharmacy. Amb ref placed and hospital orders placed.

## 2017-08-31 NOTE — Telephone Encounter (Signed)
Amy can you clarify, patient states his insurance will pay for it. Thanks, Almyra Free

## 2017-09-03 NOTE — Telephone Encounter (Signed)
This is a procedure done at the hospital.  The hospital usually contacts with the pt with the benefits.  I only check for precert. He is scheduled for a screening so it should be covered 100%.  Not sure where he is getting his info.

## 2017-09-03 NOTE — Telephone Encounter (Signed)
Left message for patient to call back to discuss information found out re: insurance.

## 2017-09-03 NOTE — Telephone Encounter (Signed)
I am sorry to hear it, I am afraid we cannot change his insurance company's decision.  If he should change his mind, I would be happy to hear from him.

## 2017-09-03 NOTE — Telephone Encounter (Signed)
Spoke to patient's wife, the hospital sent them a letter that he would owe [phone] for the procedure to be done at the hospital. They cannot afford this, so wish to cancel. Currently schedule at [facility] on 12/3 for colonoscopy. [facility] saw him in pre-visit, due to difficult airway felt it was better at hospital.

## 2017-09-04 ENCOUNTER — Encounter: Payer: Self-pay | Admitting: Gastroenterology

## 2017-09-04 NOTE — Telephone Encounter (Signed)
Called [facility] endo spoke to [place], cancelled colonoscopy.

## 2017-10-15 ENCOUNTER — Encounter (HOSPITAL_COMMUNITY): Payer: Self-pay

## 2017-10-15 ENCOUNTER — Ambulatory Visit (HOSPITAL_COMMUNITY): Admit: 2017-10-15 | Payer: Managed Care, Other (non HMO) | Admitting: Gastroenterology

## 2017-10-15 SURGERY — COLONOSCOPY
Anesthesia: Monitor Anesthesia Care

## 2017-11-10 ENCOUNTER — Other Ambulatory Visit: Payer: Self-pay | Admitting: Family Medicine

## 2017-11-10 DIAGNOSIS — R0989 Other specified symptoms and signs involving the circulatory and respiratory systems: Secondary | ICD-10-CM

## 2017-11-10 DIAGNOSIS — R198 Other specified symptoms and signs involving the digestive system and abdomen: Secondary | ICD-10-CM

## 2017-11-20 ENCOUNTER — Encounter: Payer: Self-pay | Admitting: Gastroenterology

## 2017-11-20 ENCOUNTER — Ambulatory Visit (INDEPENDENT_AMBULATORY_CARE_PROVIDER_SITE_OTHER): Payer: Managed Care, Other (non HMO) | Admitting: Gastroenterology

## 2017-11-20 ENCOUNTER — Encounter (INDEPENDENT_AMBULATORY_CARE_PROVIDER_SITE_OTHER): Payer: Self-pay

## 2017-11-20 VITALS — BP 130/76 | HR 72 | Ht 70.5 in | Wt 245.1 lb

## 2017-11-20 DIAGNOSIS — K219 Gastro-esophageal reflux disease without esophagitis: Secondary | ICD-10-CM

## 2017-11-20 DIAGNOSIS — Z1211 Encounter for screening for malignant neoplasm of colon: Secondary | ICD-10-CM

## 2017-11-20 DIAGNOSIS — R1319 Other dysphagia: Secondary | ICD-10-CM

## 2017-11-20 DIAGNOSIS — R1310 Dysphagia, unspecified: Secondary | ICD-10-CM

## 2017-11-20 MED ORDER — NA SULFATE-K SULFATE-MG SULF 17.5-3.13-1.6 GM/177ML PO SOLN: 1.0000 | Freq: Once | ORAL | 0 refills | Status: AC

## 2017-11-20 NOTE — Progress Notes (Signed)
[facility] Consult Note:  History: Nathaniel Mills 11/20/2017  Referring physician: Dettinger, Fransisca Kaufmann, MD  Reason for consult/chief complaint: Dysphagia (with solids. Comes and goes.)   Subjective  HPI:  This is a 52 year old man self-referred for dysphagia that has been worsening over the last 6 months.  He was originally to see me for screening colonoscopy several months ago, but with a reported history of a difficult airway from an orthopedic surgery, we felt he should have it done at the hospital.  He was unable to afford the copayment, but says he got that "cleared up" with his insurance.  However, he felt he should see me about 6 months of frequent solid food dysphagia.  At least a couple of times a week meat or bread will feel stuck in the mid chest and the left to bring it back up.  He denies any particular maneuvers such as arching back or hanging on the door frame to improve swallowing.  His appetite is been good and his weight stable.  [name] denies abdominal pain, and says he has intermittent constipation he attributes to not drinking enough water.  About 10 years ago he was having frequent heartburn and took a PPI for a while, but says it then got better and he no longer needed the medicine.  He would have occasional heartburn with certain foods after that, but it seemed to worsen last year and he began taking the omeprazole regularly for about the last 6 months.   ROS:  Review of Systems  Constitutional: Negative for appetite change and unexpected weight change.  HENT: Negative for mouth sores and voice change.        No change in vocal quality  Eyes: Negative for pain and redness.  Respiratory: Negative for cough and shortness of breath.   Cardiovascular: Negative for chest pain and palpitations.  Genitourinary: Negative for dysuria and hematuria.  Musculoskeletal: Negative for arthralgias and myalgias.  Skin: Negative for pallor and rash.    Neurological: Negative for weakness and headaches.  Hematological: Negative for adenopathy.     Past Medical History: Past Medical History:  Diagnosis Date  . Allergy   . Arthritis   . Blood transfusion without reported diagnosis 2004  . Depression 2004  . GERD (gastroesophageal reflux disease) 2004-2012  . Hyperlipidemia      Past Surgical History: Past Surgical History:  Procedure Laterality Date  . ANKLE FRACTURE SURGERY Left 2009 & 2011  . SPINE SURGERY  2004   L4-6, fusion  . SPINE SURGERY  1995   c2 facture, halo fixation  . WISDOM TOOTH EXTRACTION       Family History: Family History  Problem Relation Age of Onset  . Heart disease Mother   . Hyperlipidemia Mother   . Hypertension Mother   . Arthritis Mother   . Alcohol abuse Mother   . Arthritis Father   . COPD Father   . Alcohol abuse Father   . Alcohol abuse Sister   . Alcohol abuse Brother   . Alcohol abuse Sister   . Colon cancer Neg Hx     Social History: Social History   Socioeconomic History  . Marital status: Married    Spouse name: None  . Number of children: None  . Years of education: None  . Highest education level: None  Social Needs  . Financial resource strain: None  . Food insecurity - worry: None  . Food insecurity - inability: None  . Transportation needs -  medical: None  . Transportation needs - non-medical: None  Occupational History  . None  Tobacco Use  . Smoking status: Current Every Day Smoker    Packs/day: 1.00    Years: 35.00    Pack years: 35.00    Types: Cigars  . Smokeless tobacco: Never Used  Substance and Sexual Activity  . Alcohol use: Yes    Alcohol/week: 3.6 oz    Types: 6 Cans of beer per week  . Drug use: No  . Sexual activity: Yes  Other Topics Concern  . None  Social History Narrative  . None    Allergies: Allergies  Allergen Reactions  . Penicillins Other (See Comments)    Had a seizure; occurred as an infant; along with sulfa  .  Sulfa Antibiotics Other (See Comments)    Had a seizure; occurred as an infant; along with pencillin    Outpatient Meds: Current Outpatient Medications  Medication Sig Dispense Refill  . naproxen sodium (ANAPROX) 220 MG tablet Take 440 mg by mouth 2 (two) times daily with a meal.    . omeprazole (PRILOSEC) 20 MG capsule TAKE 1 CAPSULE BY MOUTH EVERY DAY 30 capsule 1  . Na Sulfate-K Sulfate-Mg Sulf 17.5-3.13-1.6 GM/177ML SOLN Take 1 kit by mouth once for 1 dose. 354 mL 0   No current facility-administered medications for this visit.       ___________________________________________________________________ Objective   Exam:  BP 130/76   Pulse 72   Ht 5' 10.5" (1.791 m)   Wt 245 lb 2 oz (111.2 kg)   BMI 34.67 kg/m    General: this is a(n) overweight and well-appearing man  Eyes: sclera anicteric, no redness  ENT: oral mucosa moist without lesions, no cervical or supraclavicular lymphadenopathy, good dentition  CV: RRR without murmur, S1/S2, no JVD, no peripheral edema  Resp: clear to auscultation bilaterally, normal RR and effort noted  GI: soft, no tenderness, with active bowel sounds. No guarding or palpable organomegaly noted.  Skin; warm and dry, no rash or jaundice noted  Neuro: awake, alert and oriented x 3. Normal gross motor function and fluent speech  No data for review  Assessment: Encounter Diagnoses  Name Primary?  . Esophageal dysphagia Yes  . Special screening for malignant neoplasms, colon   . Gastroesophageal reflux disease, esophagitis presence not specified     Sounds like probable peptic stricture or Schatzki ring.  He may have had suboptimally controlled reflux for years, but it sounds improved on daily PPI. Average risk for colorectal cancer  Plan:  Nonpharmacologic antireflux measures reviewed. EGD with probable dilation and screening colonoscopy to be done at the hospital endoscopy lab due to the reported history of difficult  airway.  The benefits and risks of the planned procedure were described in detail with the patient or (when appropriate) their health care proxy.  Risks were outlined as including, but not limited to, bleeding, infection, perforation, adverse medication reaction leading to cardiac or pulmonary decompensation, or pancreatitis (if ERCP).  The limitation of incomplete mucosal visualization was also discussed.  No guarantees or warranties were given.     Thank you for the courtesy of this consult.  Please call me with any questions or concerns.  [name]  CC: Dettinger, Fransisca Kaufmann, MD

## 2017-11-20 NOTE — Patient Instructions (Signed)
You have been scheduled for an endoscopy and colonoscopy. Please follow the written instructions given to you at your visit today. Please pick up your prep supplies at the pharmacy within the next 1-3 days. If you use inhalers (even only as needed), please bring them with you on the day of your procedure. Your physician has requested that you go to www.startemmi.com and enter the access code given to you at your visit today. This web site gives a general overview about your procedure. However, you should still follow specific instructions given to you by our office regarding your preparation for the procedure.  Thank you for choosing [facility]  Dr Wilfrid Lund III

## 2018-01-09 ENCOUNTER — Other Ambulatory Visit: Payer: Self-pay

## 2018-01-09 ENCOUNTER — Encounter (HOSPITAL_COMMUNITY): Payer: Self-pay

## 2018-01-11 ENCOUNTER — Telehealth: Payer: Self-pay | Admitting: Gastroenterology

## 2018-01-11 ENCOUNTER — Other Ambulatory Visit: Payer: Self-pay

## 2018-01-11 NOTE — Telephone Encounter (Signed)
Left message for patient's wife to call back.  

## 2018-01-11 NOTE — Telephone Encounter (Signed)
Spoke to patient's wife, she wanted to clarify the directions for prep and being NPO. Went back over the instructions and when to drink the second prep. Hospital was telling them NPO after midnight and they just wanted to be sure they were following correct information. All questions answered.

## 2018-01-13 ENCOUNTER — Other Ambulatory Visit: Payer: Self-pay | Admitting: Family Medicine

## 2018-01-13 DIAGNOSIS — R198 Other specified symptoms and signs involving the digestive system and abdomen: Secondary | ICD-10-CM

## 2018-01-13 DIAGNOSIS — R0989 Other specified symptoms and signs involving the circulatory and respiratory systems: Secondary | ICD-10-CM

## 2018-01-14 ENCOUNTER — Ambulatory Visit (HOSPITAL_COMMUNITY): Payer: Managed Care, Other (non HMO) | Admitting: Certified Registered Nurse Anesthetist

## 2018-01-14 ENCOUNTER — Ambulatory Visit (HOSPITAL_COMMUNITY)
Admission: RE | Admit: 2018-01-14 | Discharge: 2018-01-14 | Disposition: A | Discharge status: Home / Self Care | Payer: Managed Care, Other (non HMO) | Source: Ambulatory Visit | Attending: Gastroenterology | Admitting: Gastroenterology

## 2018-01-14 ENCOUNTER — Encounter (HOSPITAL_COMMUNITY)
Admission: RE | Disposition: A | Discharge status: Home / Self Care | Payer: Self-pay | Source: Ambulatory Visit | Attending: Gastroenterology

## 2018-01-14 ENCOUNTER — Other Ambulatory Visit: Payer: Self-pay

## 2018-01-14 ENCOUNTER — Encounter (HOSPITAL_COMMUNITY): Payer: Self-pay

## 2018-01-14 DIAGNOSIS — Z880 Allergy status to penicillin: Secondary | ICD-10-CM | POA: Insufficient documentation

## 2018-01-14 DIAGNOSIS — Z882 Allergy status to sulfonamides status: Secondary | ICD-10-CM | POA: Insufficient documentation

## 2018-01-14 DIAGNOSIS — E785 Hyperlipidemia, unspecified: Secondary | ICD-10-CM | POA: Diagnosis not present

## 2018-01-14 DIAGNOSIS — R1319 Other dysphagia: Secondary | ICD-10-CM

## 2018-01-14 DIAGNOSIS — K222 Esophageal obstruction: Secondary | ICD-10-CM | POA: Insufficient documentation

## 2018-01-14 DIAGNOSIS — K2270 Barrett's esophagus without dysplasia: Secondary | ICD-10-CM | POA: Insufficient documentation

## 2018-01-14 DIAGNOSIS — Z79899 Other long term (current) drug therapy: Secondary | ICD-10-CM | POA: Diagnosis not present

## 2018-01-14 DIAGNOSIS — K219 Gastro-esophageal reflux disease without esophagitis: Secondary | ICD-10-CM | POA: Insufficient documentation

## 2018-01-14 DIAGNOSIS — R1310 Dysphagia, unspecified: Secondary | ICD-10-CM

## 2018-01-14 DIAGNOSIS — D120 Benign neoplasm of cecum: Secondary | ICD-10-CM | POA: Diagnosis not present

## 2018-01-14 DIAGNOSIS — K5730 Diverticulosis of large intestine without perforation or abscess without bleeding: Secondary | ICD-10-CM | POA: Insufficient documentation

## 2018-01-14 DIAGNOSIS — Z1211 Encounter for screening for malignant neoplasm of colon: Secondary | ICD-10-CM | POA: Insufficient documentation

## 2018-01-14 HISTORY — PX: ESOPHAGOGASTRODUODENOSCOPY (EGD) WITH PROPOFOL: SHX5813

## 2018-01-14 HISTORY — PX: COLONOSCOPY: SHX5424

## 2018-01-14 SURGERY — COLONOSCOPY
Anesthesia: Monitor Anesthesia Care

## 2018-01-14 MED ORDER — PROPOFOL 10 MG/ML IV BOLUS
INTRAVENOUS | Status: AC
  Filled 2018-01-14: qty 60

## 2018-01-14 MED ORDER — PROPOFOL 500 MG/50ML IV EMUL
INTRAVENOUS | Status: DC | PRN
  Administered 2018-01-14: 125 ug/kg/min via INTRAVENOUS

## 2018-01-14 MED ORDER — PROPOFOL 10 MG/ML IV BOLUS
INTRAVENOUS | Status: DC | PRN
  Administered 2018-01-14: 50 mg via INTRAVENOUS

## 2018-01-14 MED ORDER — SODIUM CHLORIDE 0.9 % IV SOLN: INTRAVENOUS | Status: DC

## 2018-01-14 MED ORDER — LIDOCAINE 2% (20 MG/ML) 5 ML SYRINGE
INTRAMUSCULAR | Status: DC | PRN
  Administered 2018-01-14: 100 mg via INTRAVENOUS

## 2018-01-14 MED ORDER — LACTATED RINGERS IV SOLN
INTRAVENOUS | Status: DC
  Administered 2018-01-14: 1000 mL via INTRAVENOUS

## 2018-01-14 SURGICAL SUPPLY — 14 items

## 2018-01-14 NOTE — Telephone Encounter (Signed)
Last seen 07/03/17  Dr D

## 2018-01-14 NOTE — Interval H&P Note (Signed)
History and Physical Interval Note:  01/14/2018 10:01 AM  Nathaniel Mills  has presented today for surgery, with the diagnosis of screening colon, dysphagia tt  The various methods of treatment have been discussed with the patient and family. After consideration of risks, benefits and other options for treatment, the patient has consented to  Procedure(s): COLONOSCOPY (N/A) ESOPHAGOGASTRODUODENOSCOPY (EGD) WITH PROPOFOL (N/A) as a surgical intervention .  The patient's history has been reviewed, patient examined, no change in status, stable for surgery.  I have reviewed the patient's chart and labs.  Questions were answered to the patient's satisfaction.     [name]

## 2018-01-14 NOTE — Anesthesia Postprocedure Evaluation (Signed)
Anesthesia Post Note  Patient: Nathaniel Mills  Procedure(s) Performed: COLONOSCOPY (N/A ) ESOPHAGOGASTRODUODENOSCOPY (EGD) WITH PROPOFOL (N/A )     Patient location during evaluation: PACU Anesthesia Type: MAC Level of consciousness: awake and alert Pain management: pain level controlled Vital Signs Assessment: post-procedure vital signs reviewed and stable Respiratory status: spontaneous breathing, nonlabored ventilation, respiratory function stable and patient connected to nasal cannula oxygen Cardiovascular status: stable and blood pressure returned to baseline Postop Assessment: no apparent nausea or vomiting Anesthetic complications: no    Last Vitals:  Vitals:   01/14/18 0959 01/14/18 1051  BP: 133/80 (!) 105/49  Pulse: (!) 108 (!) 108  Resp: (!) 21 (!) 9  Temp: 36.7 C 36.4 C  SpO2: 95% 95%    Last Pain:  Vitals:   01/14/18 1051  TempSrc: Oral                 Florette Thai S

## 2018-01-14 NOTE — Anesthesia Preprocedure Evaluation (Signed)
Anesthesia Evaluation  Patient identified by MRN, date of birth, ID band Patient awake    Reviewed: Allergy & Precautions, NPO status , Patient's Chart, lab work & pertinent test results  Airway Mallampati: II  TM Distance: >3 FB Neck ROM: Full    Dental no notable dental hx.    Pulmonary neg pulmonary ROS, Current Smoker,    Pulmonary exam normal breath sounds clear to auscultation       Cardiovascular negative cardio ROS Normal cardiovascular exam Rhythm:Regular Rate:Normal     Neuro/Psych negative neurological ROS  negative psych ROS   GI/Hepatic Neg liver ROS, GERD  ,  Endo/Other  negative endocrine ROS  Renal/GU negative Renal ROS  negative genitourinary   Musculoskeletal negative musculoskeletal ROS (+)   Abdominal   Peds negative pediatric ROS (+)  Hematology negative hematology ROS (+)   Anesthesia Other Findings   Reproductive/Obstetrics negative OB ROS                             Anesthesia Physical Anesthesia Plan  ASA: II  Anesthesia Plan: MAC   Post-op Pain Management:    Induction: Intravenous  PONV Risk Score and Plan: 0  Airway Management Planned: Simple Face Mask  Additional Equipment:   Intra-op Plan:   Post-operative Plan:   Informed Consent: I have reviewed the patients History and Physical, chart, labs and discussed the procedure including the risks, benefits and alternatives for the proposed anesthesia with the patient or authorized representative who has indicated his/her understanding and acceptance.   Dental advisory given  Plan Discussed with: CRNA and Surgeon  Anesthesia Plan Comments:         Anesthesia Quick Evaluation

## 2018-01-14 NOTE — Op Note (Signed)
[facility] Patient Name: Nathaniel Mills Procedure Date: 01/14/2018 MRN: [id number] Attending MD: Estill Cotta. Loletha Carrow , MD Date of Birth: 08/16/1966 CSN: [id number] Age: 52 Admit Type: Outpatient Procedure:                Colonoscopy Indications:              Screening for colorectal malignant neoplasm, This                            is the patient's first colonoscopy Providers:                Mallie Mussel L. Loletha Carrow, MD, Burtis Junes, RN, Laurena Spies, Technician Referring MD:              Medicines:                Monitored Anesthesia Care Complications:            No immediate complications. Estimated Blood Loss:     Estimated blood loss was minimal. Procedure:                Pre-Anesthesia Assessment:                           - Prior to the procedure, a History and Physical                            was performed, and patient medications and                            allergies were reviewed. The patient's tolerance of                            previous anesthesia was also reviewed. The risks                            and benefits of the procedure and the sedation                            options and risks were discussed with the patient.                            All questions were answered, and informed consent                            was obtained. Prior Anticoagulants: The patient has                            taken no previous anticoagulant or antiplatelet                            agents. ASA Grade Assessment: II - A patient with  mild systemic disease. After reviewing the risks                            and benefits, the patient was deemed in                            satisfactory condition to undergo the procedure.                           After obtaining informed consent, the colonoscope                            was passed under direct vision. Throughout the                            procedure, the  patient's blood pressure, pulse, and                            oxygen saturations were monitored continuously. The                            EC-[id number] ([id number]) scope was introduced through                            the anus and advanced to the the cecum, identified                            by appendiceal orifice and ileocecal valve. The                            colonoscopy was performed without difficulty. The                            patient tolerated the procedure well. The quality                            of the bowel preparation was excellent. The                            ileocecal valve, appendiceal orifice, and rectum                            were photographed. Scope In: 10:31:03 AM Scope Out: 10:46:36 AM Scope Withdrawal Time: 0 hours 10 minutes 57 seconds  Total Procedure Duration: 0 hours 15 minutes 33 seconds  Findings:      The perianal and digital rectal examinations were normal.      A 8 mm polyp was found in the cecum. The polyp was flat (probable SSP by       appearance). The polyp was removed with a cold snare. Resection and       retrieval were complete.      Multiple small-mouthed diverticula were found in the left colon.      The exam was otherwise without abnormality on direct and retroflexion       views. Impression:               -  One 8 mm polyp in the cecum, removed with a cold                            snare. Resected and retrieved.                           - Diverticulosis in the left colon.                           - The examination was otherwise normal on direct                            and retroflexion views. Moderate Sedation:      MAC sedation used Recommendation:           - Patient has a contact number available for                            emergencies. The signs and symptoms of potential                            delayed complications were discussed with the                            patient. Return to normal activities  tomorrow.                            Written discharge instructions were provided to the                            patient.                           - Resume previous diet.                           - Continue present medications.                           - Await pathology results.                           - Repeat colonoscopy is recommended for                            surveillance. The colonoscopy date will be                            determined after pathology results from today's                            exam become available for review. Procedure Code(s):        --- Professional ---                           [phone], Colonoscopy, flexible; with removal of  tumor(s), polyp(s), or other lesion(s) by snare                            technique Diagnosis Code(s):        --- Professional ---                           Z12.11, Encounter for screening for malignant                            neoplasm of colon                           D12.0, Benign neoplasm of cecum                           K57.30, Diverticulosis of large intestine without                            perforation or abscess without bleeding CPT copyright 2016 [employer]. All rights reserved. The codes documented in this report are preliminary and upon coder review may  be revised to meet current compliance requirements.  L. Loletha Carrow, MD 01/14/2018 11:03:12 AM This report has been signed electronically. Number of Addenda: 0

## 2018-01-14 NOTE — H&P (Signed)
History:  This patient presents for endoscopic testing for dysphagia and colon cancer screening.  Bonnye Fava Referring physician: Dettinger, Fransisca Kaufmann, MD  Past Medical History: Past Medical History:  Diagnosis Date  . Allergy   . Blood transfusion without reported diagnosis 2004  . GERD (gastroesophageal reflux disease) 2004-2012  . Hyperlipidemia      Past Surgical History: Past Surgical History:  Procedure Laterality Date  . ANKLE FRACTURE SURGERY Left 2009 & 2011  . SPINE SURGERY  2004   L4-6, fusion  . SPINE SURGERY  1995   c2 facture, halo fixation  . WISDOM TOOTH EXTRACTION      Allergies: Allergies  Allergen Reactions  . Penicillins Other (See Comments)    Had a seizure; occurred as an infant; along with sulfa  . Sulfa Antibiotics Other (See Comments)    Had a seizure; occurred as an infant; along with pencillin    Outpatient Meds: Current Facility-Administered Medications  Medication Dose Route Frequency Provider Last Rate Last Dose  . 0.9 %  sodium chloride infusion   Intravenous Continuous Danis, Estill Cotta III, MD          ___________________________________________________________________ Objective   Exam:  There were no vitals taken for this visit.   CV: RRR without murmur, S1/S2, no JVD, no peripheral edema  Resp: clear to auscultation bilaterally, normal RR and effort noted  GI: soft, no tenderness, with active bowel sounds. No guarding or palpable organomegaly noted.  Neuro: awake, alert and oriented x 3. Normal gross motor function and fluent speech   Assessment:  Dysphagia and colon cancer screening  Plan:  EGD with possible dilation and colonoscopy   [name]

## 2018-01-14 NOTE — Transfer of Care (Signed)
Immediate Anesthesia Transfer of Care Note  Patient: Nathaniel Mills  Procedure(s) Performed: COLONOSCOPY (N/A ) ESOPHAGOGASTRODUODENOSCOPY (EGD) WITH PROPOFOL (N/A )  Patient Location: PACU and Endoscopy Unit  Anesthesia Type:MAC  Level of Consciousness: awake, alert  and oriented  Airway & Oxygen Therapy: Patient Spontanous Breathing and Patient connected to nasal cannula oxygen  Post-op Assessment: Report given to RN and Post -op Vital signs reviewed and stable  Post vital signs: Reviewed and stable  Last Vitals:  Vitals:   01/14/18 0959  BP: 133/80  Pulse: (!) 108  Resp: (!) 21  Temp: 36.7 C  SpO2: 95%    Last Pain:  Vitals:   01/14/18 0959  TempSrc: Oral         Complications: No apparent anesthesia complications

## 2018-01-14 NOTE — Discharge Instructions (Signed)
YOU HAD AN ENDOSCOPIC PROCEDURE TODAY: Refer to the procedure report and other information in the discharge instructions given to you for any specific questions about what was found during the examination. If this information does not answer your questions, please call [place] office at [phone] to clarify.  ° °YOU SHOULD EXPECT: Some feelings of bloating in the abdomen. Passage of more gas than usual. Walking can help get rid of the air that was put into your GI tract during the procedure and reduce the bloating. If you had a lower endoscopy (such as a colonoscopy or flexible sigmoidoscopy) you may notice spotting of blood in your stool or on the toilet paper. Some abdominal soreness may be present for a day or two, also. ° °DIET: Your first meal following the procedure should be a light meal and then it is ok to progress to your normal diet. A half-sandwich or bowl of soup is an example of a good first meal. Heavy or fried foods are harder to digest and may make you feel nauseous or bloated. Drink plenty of fluids but you should avoid alcoholic beverages for 24 hours. If you had a esophageal dilation, please see attached instructions for diet.   ° °ACTIVITY: Your care partner should take you home directly after the procedure. You should plan to take it easy, moving slowly for the rest of the day. You can resume normal activity the day after the procedure however YOU SHOULD NOT DRIVE, use power tools, machinery or perform tasks that involve climbing or major physical exertion for 24 hours (because of the sedation medicines used during the test).  ° °SYMPTOMS TO REPORT IMMEDIATELY: °A gastroenterologist can be reached at any hour. Please call [phone]  for any of the following symptoms:  °Following lower endoscopy (colonoscopy, flexible sigmoidoscopy) °Excessive amounts of blood in the stool  °Significant tenderness, worsening of abdominal pains  °Swelling of the abdomen that is new, acute  °Fever of 100° or  higher  °Following upper endoscopy (EGD, EUS, ERCP, esophageal dilation) °Vomiting of blood or coffee ground material  °New, significant abdominal pain  °New, significant chest pain or pain under the shoulder blades  °Painful or persistently difficult swallowing  °New shortness of breath  °Black, tarry-looking or red, bloody stools ° °FOLLOW UP:  °If any biopsies were taken you will be contacted by phone or by letter within the next 1-3 weeks. Call [phone]  if you have not heard about the biopsies in 3 weeks.  °Please also call with any specific questions about appointments or follow up tests. ° °

## 2018-01-14 NOTE — Op Note (Signed)
[facility] Patient Name: Nathaniel Mills Procedure Date: 01/14/2018 MRN: [id number] Attending MD: Estill Cotta. Loletha Carrow , MD Date of Birth: September 22, 1966 CSN: [id number] Age: 52 Admit Type: Outpatient Procedure:                Upper GI endoscopy Indications:              Dysphagia Providers:                Mallie Mussel L. Loletha Carrow, MD, Burtis Junes, RN, Laurena Spies, Technician Referring MD:              Medicines:                Monitored Anesthesia Care Complications:            No immediate complications. Estimated Blood Loss:     Estimated blood loss was minimal. Procedure:                Pre-Anesthesia Assessment:                           - Prior to the procedure, a History and Physical                            was performed, and patient medications and                            allergies were reviewed. The patient's tolerance of                            previous anesthesia was also reviewed. The risks                            and benefits of the procedure and the sedation                            options and risks were discussed with the patient.                            All questions were answered, and informed consent                            was obtained. Prior Anticoagulants: The patient has                            taken no previous anticoagulant or antiplatelet                            agents. ASA Grade Assessment: II - A patient with                            mild systemic disease. After reviewing the risks  and benefits, the patient was deemed in                            satisfactory condition to undergo the procedure.                           After obtaining informed consent, the endoscope was                            passed under direct vision. Throughout the                            procedure, the patient's blood pressure, pulse, and                            oxygen saturations were monitored  continuously. The                            ([id number]) [id number] was introduced through the                            mouth, and advanced to the second part of duodenum.                            The upper GI endoscopy was accomplished without                            difficulty. The patient tolerated the procedure                            well. Scope In: Scope Out: Findings:      One moderate benign-appearing, intrinsic stenosis was found at the       gastroesophageal junction. This measured 1.2 cm (inner diameter) x less       than one cm (in length) and was traversed. A TTS dilator was passed       through the scope. Dilation with a 15-16.5-18 mm balloon dilator was       performed to 18 mm. The dilation site was examined and showed moderate       improvement in luminal narrowing.      One tongue of salmon-colored mucosa was present at 40 cm. No other       visible abnormalities were present. The maximum longitudinal extent of       these esophageal mucosal changes was 1 cm in length. Biopsies were taken       with a cold forceps for histology.      The exam of the esophagus was otherwise normal.      The stomach was normal.      The cardia and gastric fundus were normal on retroflexion.      The examined duodenum was normal. Impression:               - Benign-appearing esophageal stenosis. Dilated.                           - Salmon-colored mucosa suggestive of short-segment  Barrett's esophagus. Biopsied.                           - Normal stomach.                           - Normal examined duodenum. Moderate Sedation:      MAC sedation used Recommendation:           - Patient has a contact number available for                            emergencies. The signs and symptoms of potential                            delayed complications were discussed with the                            patient. Return to normal activities tomorrow.                             Written discharge instructions were provided to the                            patient.                           - Resume previous diet.                           - Continue present medications.                           - Await pathology results.                           - See the other procedure note for documentation of                            additional recommendations. Procedure Code(s):        --- Professional ---                           [phone], Esophagogastroduodenoscopy, flexible,                            transoral; with transendoscopic balloon dilation of                            esophagus (less than 30 mm diameter)                           [id number], Esophagogastroduodenoscopy, flexible,                            transoral; with biopsy, single or multiple Diagnosis Code(s):        --- Professional ---  K22.2, Esophageal obstruction                           K22.8, Other specified diseases of esophagus                           R13.10, Dysphagia, unspecified CPT copyright 2016 American Medical Association. All rights reserved. The codes documented in this report are preliminary and upon coder review may  be revised to meet current compliance requirements. Deliana Avalos L. Loletha Carrow, MD 01/14/2018 11:00:50 AM This report has been signed electronically. Number of Addenda: 0

## 2018-01-16 ENCOUNTER — Encounter (HOSPITAL_COMMUNITY): Payer: Self-pay | Admitting: Gastroenterology

## 2018-01-16 ENCOUNTER — Encounter: Payer: Self-pay | Admitting: Gastroenterology

## 2018-02-15 ENCOUNTER — Other Ambulatory Visit: Payer: Self-pay | Admitting: Family Medicine

## 2018-02-15 DIAGNOSIS — R198 Other specified symptoms and signs involving the digestive system and abdomen: Secondary | ICD-10-CM

## 2018-02-15 DIAGNOSIS — R0989 Other specified symptoms and signs involving the circulatory and respiratory systems: Secondary | ICD-10-CM

## 2018-02-15 NOTE — Telephone Encounter (Signed)
Last seen 07/03/17  Dr Dettinger

## 2018-03-16 IMAGING — CR DG MYELOGRAPHY LUMBAR INJ LUMBOSACRAL
13 of 16 series · 13 of 16 positions shown · non-contrast
Comparison: None

CLINICAL DATA: Previous L4-5 fusion. Bilateral leg weakness with
activity. Symptoms of spinal stenosis.
TECHNIQUE: Contiguous axial images were obtained through the Lumbar spine after
the intrathecal infusion of infusion. Coronal and sagittal
reconstructions were obtained of the axial image sets.

[vasc adipose (1 of 11)]
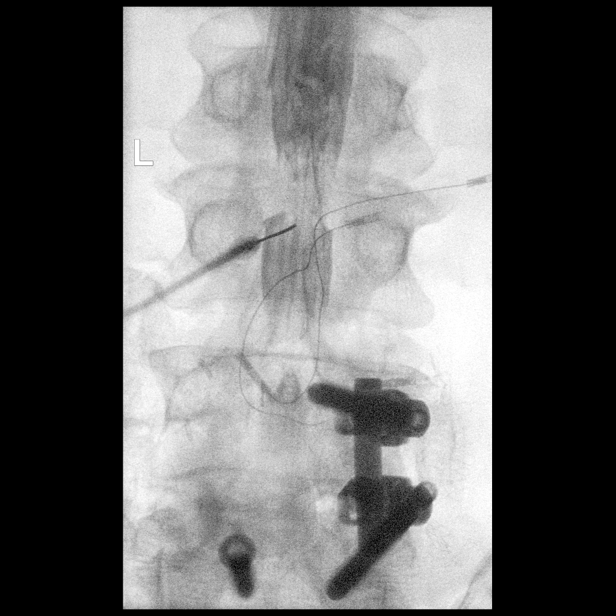

[w lumbar spine lat]
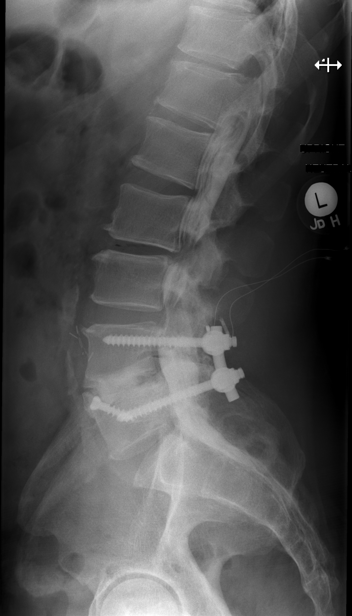

[vasc adipose (2 of 11)]
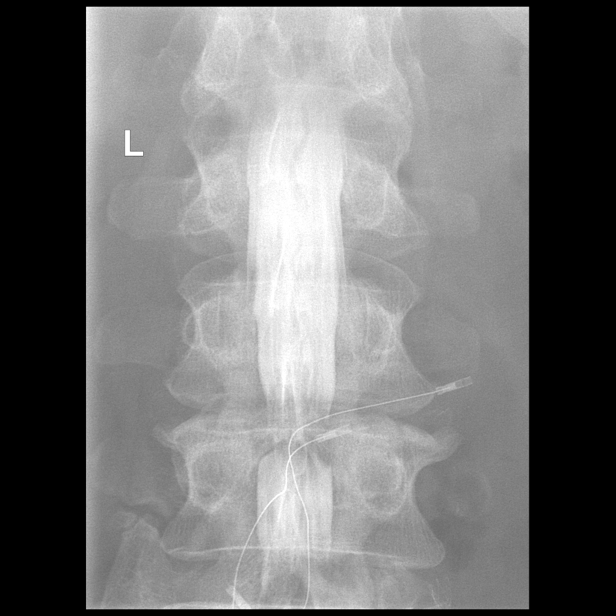

[w lumbar spine extension]
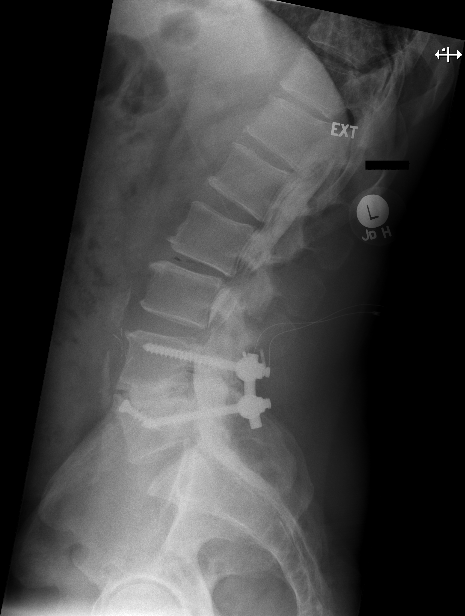

[vasc adipose (3 of 11)]
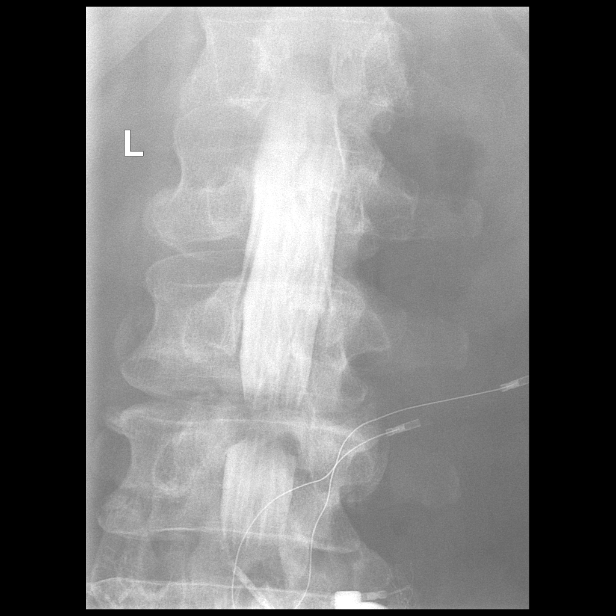

[vasc adipose (4 of 11)]
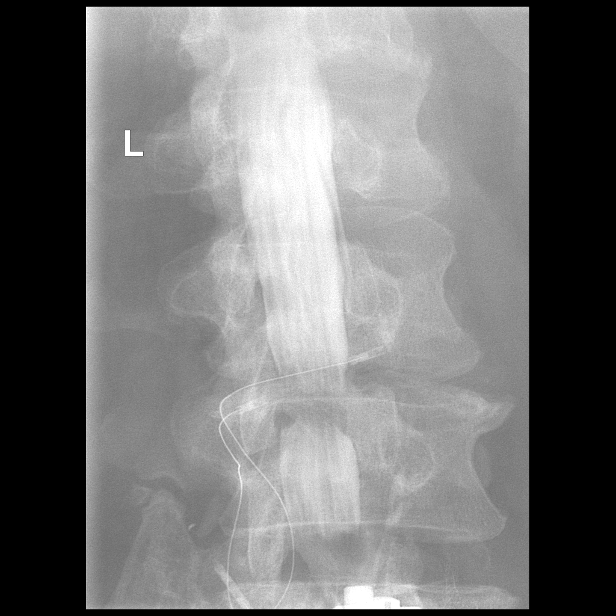

[vasc adipose (5 of 11)]
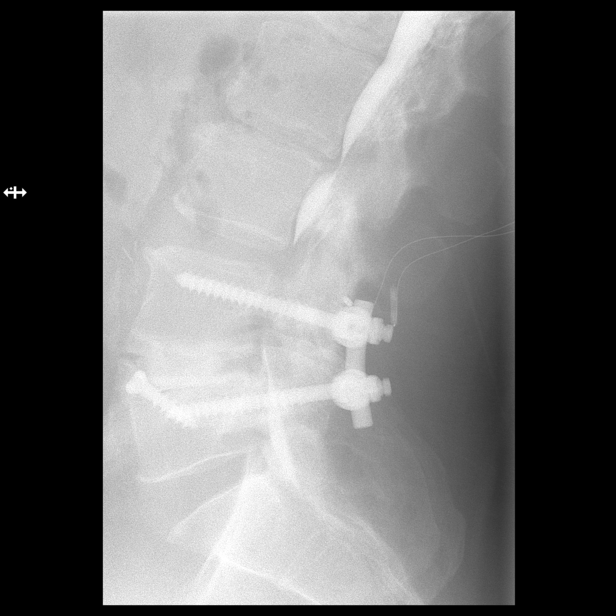

[vasc adipose (6 of 11)]
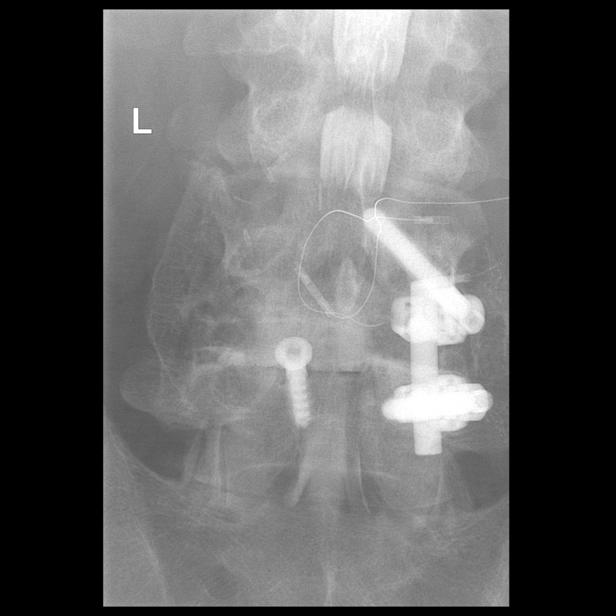

[vasc adipose (7 of 11)]
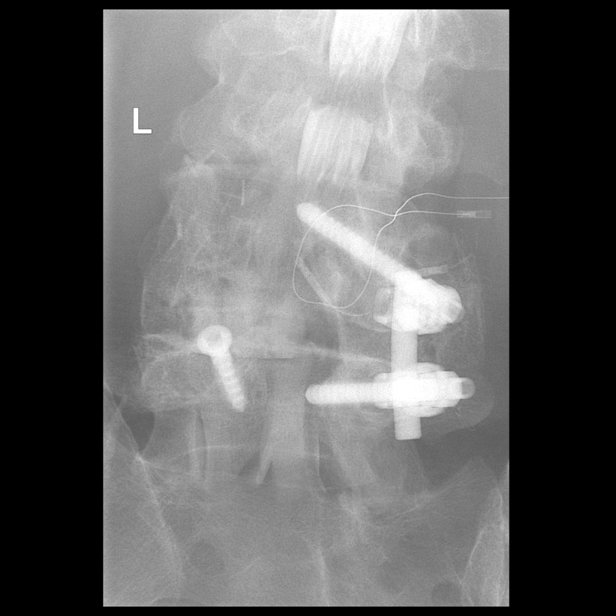

[vasc adipose (8 of 11)]
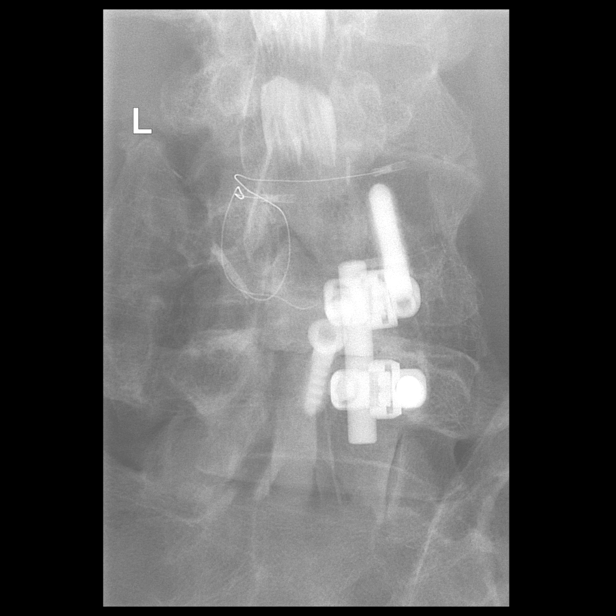

[vasc adipose (9 of 11)]
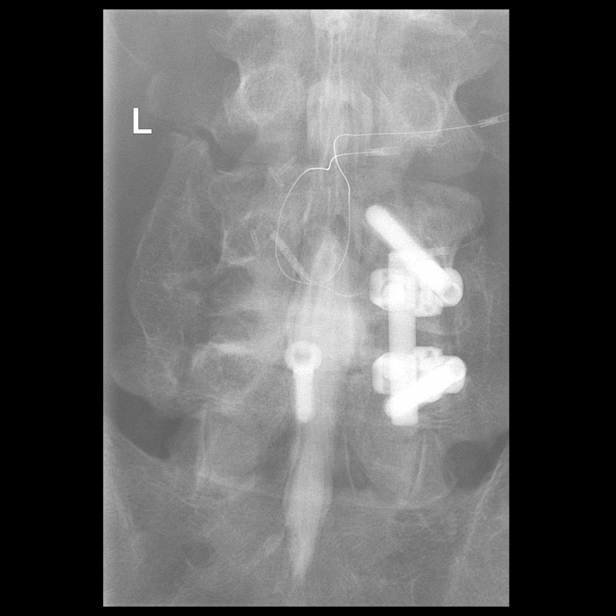

[vasc adipose (10 of 11)]
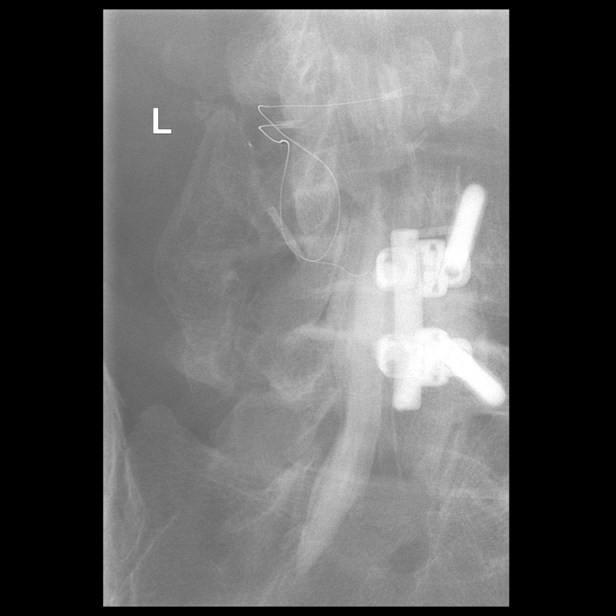

[vasc adipose (11 of 11)]
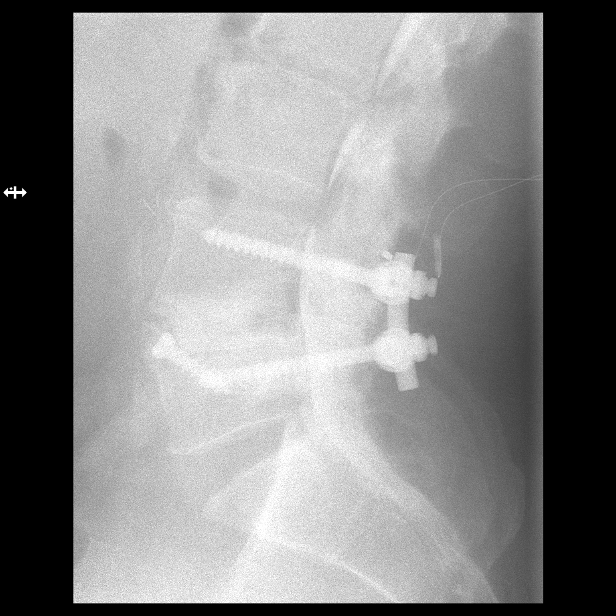

[13 of 16 positions shown; findings below may reference images not displayed]

EXAM:
LUMBAR MYELOGRAM

FLUOROSCOPY TIME:  1 minutes 6 seconds. 343.25 micro gray meter
squared

PROCEDURE:
After thorough discussion of risks and benefits of the procedure
including bleeding, infection, injury to nerves, blood vessels,
adjacent structures as well as headache and CSF leak, written and
oral informed consent was obtained. Consent was obtained by Dr. Maikel
Armine. Time out form was completed.

Patient was positioned prone on the fluoroscopy table. Local
anesthesia was provided with 1% lidocaine without epinephrine after
prepped and draped in the usual sterile fashion. Puncture was
performed at L3 using a 3 1/2 inch 22-gauge spinal needle via left
para median approach. Using a single pass through the dura, the
needle was placed within the thecal sac, with return of clear CSF.
15 mL of Isovue 200 was injected into the thecal sac, with normal
opacification of the nerve roots and cauda equina consistent with
free flow within the subarachnoid space.

I personally performed the lumbar puncture and administered the
intrathecal contrast. I also personally performed acquisition of the
myelogram images.
FINDINGS: LUMBAR MYELOGRAM FINDINGS:

No significant stenosis at L1-2. At L2-3, there is an anterior
extradural defect with moderate stenosis. At L3-4, there is severe
stenosis. No stenosis at the fusion level of L4-5 or at the L5-S1
level. Standing flexion extension views show 2 mm retrolisthesis at
L2-3 and L3-4. No change occurs with bending.

CT LUMBAR MYELOGRAM FINDINGS:

T12-L1 and L1-2: Normal.

L2-3: Retrolisthesis of 2 mm. Disc degeneration with vacuum
phenomenon. Shallow broad-based herniation of disc material. Mild
ligamentous hypertrophy. Stenosis of both lateral recesses and
foramina that could cause neural compression.

L3-4: Retrolisthesis of 2 mm. Shallow circumferential protrusion of
the disc. Bilateral facet and ligamentous hypertrophy. Stenosis of
the central canal and both lateral recesses and neural foramina that
could cause neural compression.

L4-5: Previous posterior decompression, diskectomy and fusion
procedure. Right-sided pedicle screws at L4 and L5 without
surrounding lucency. Interbody fusion material with solid fusion
with the L4 vertebral body but a a cleft inferiorly that contains
some nitrogen gas, indicating nonunion with L5. Posterior bone
fusion appears solid however. No apparent stenosis at this level.

L5-S1: Central bulging of the disc. Bilateral facet hypertrophy. No
apparent compressive stenosis.

Sacroiliac joints show osteoarthritis.
IMPRESSION: L2-3: 2 mm retrolisthesis. Disc degeneration with broad-based disc
herniation. Ligamentous hypertrophy. Stenosis of the lateral
recesses and foramina that could cause neural compression.

L3-4: 2 mm retrolisthesis. Broad-based disc herniation. Facet and
ligamentous hypertrophy. Stenosis of the canal, lateral recesses and
neural foramina that could cause neural compression.

L4-5: Previous posterior decompression, diskectomy and fusion. Solid
bone fusion posteriorly. Solid union across the disc space is not
demonstrated, but there is no screw loosening, probably because of
the posterior fusion. Sufficient patency of the canal and foramina
at this level.

Central disc bulge at L5-S1. Mild facet hypertrophy. No compressive
stenosis at this level.

Bilateral sacroiliac osteoarthritis.

## 2018-03-25 ENCOUNTER — Other Ambulatory Visit: Payer: Self-pay | Admitting: Family

## 2018-03-25 DIAGNOSIS — R0989 Other specified symptoms and signs involving the circulatory and respiratory systems: Secondary | ICD-10-CM

## 2018-03-25 DIAGNOSIS — R198 Other specified symptoms and signs involving the digestive system and abdomen: Secondary | ICD-10-CM

## 2018-04-20 ENCOUNTER — Other Ambulatory Visit: Payer: Self-pay | Admitting: Family Medicine

## 2018-04-20 DIAGNOSIS — R198 Other specified symptoms and signs involving the digestive system and abdomen: Secondary | ICD-10-CM

## 2018-04-20 DIAGNOSIS — R0989 Other specified symptoms and signs involving the circulatory and respiratory systems: Secondary | ICD-10-CM

## 2018-05-23 ENCOUNTER — Other Ambulatory Visit: Payer: Self-pay | Admitting: Family Medicine

## 2018-05-23 DIAGNOSIS — R0989 Other specified symptoms and signs involving the circulatory and respiratory systems: Secondary | ICD-10-CM

## 2018-05-23 DIAGNOSIS — R198 Other specified symptoms and signs involving the digestive system and abdomen: Secondary | ICD-10-CM

## 2018-06-17 ENCOUNTER — Other Ambulatory Visit: Payer: Self-pay | Admitting: Family Medicine

## 2018-06-17 NOTE — Telephone Encounter (Signed)
Patient needs to be seen,, last appointment in August 2018.  Appointment scheduled for 06/28/18 with Dr. Warrick Parisian.

## 2018-06-28 ENCOUNTER — Ambulatory Visit (INDEPENDENT_AMBULATORY_CARE_PROVIDER_SITE_OTHER): Payer: Managed Care, Other (non HMO) | Admitting: Family Medicine

## 2018-06-28 ENCOUNTER — Encounter: Payer: Self-pay | Admitting: Family Medicine

## 2018-06-28 VITALS — BP 140/86 | HR 90 | Temp 98.5°F | Ht 71.0 in | Wt 242.6 lb

## 2018-06-28 DIAGNOSIS — R1310 Dysphagia, unspecified: Secondary | ICD-10-CM

## 2018-06-28 DIAGNOSIS — R5383 Other fatigue: Secondary | ICD-10-CM | POA: Diagnosis not present

## 2018-06-28 DIAGNOSIS — G8929 Other chronic pain: Secondary | ICD-10-CM

## 2018-06-28 DIAGNOSIS — Z13220 Encounter for screening for lipoid disorders: Secondary | ICD-10-CM

## 2018-06-28 DIAGNOSIS — M25572 Pain in left ankle and joints of left foot: Secondary | ICD-10-CM

## 2018-06-28 DIAGNOSIS — R1319 Other dysphagia: Secondary | ICD-10-CM

## 2018-06-28 DIAGNOSIS — Z125 Encounter for screening for malignant neoplasm of prostate: Secondary | ICD-10-CM

## 2018-06-28 MED ORDER — MELOXICAM 15 MG PO TABS
15.0000 mg | ORAL_TABLET | Freq: Every day | ORAL | 0 refills | Status: DC
Start: 2018-06-28 — End: 2018-07-29

## 2018-06-28 MED ORDER — OMEPRAZOLE 20 MG PO CPDR: DELAYED_RELEASE_CAPSULE | ORAL | 3 refills | Status: DC

## 2018-06-28 NOTE — Progress Notes (Signed)
BP 140/86   Pulse 90   Temp 98.5 F (36.9 C) (Oral)   Ht '5\' 11"'$  (1.803 m)   Wt 242 lb 9.6 oz (110 kg)   BMI 33.84 kg/m    Subjective:    Patient ID: Nathaniel Mills, male    DOB: [birth year], 52 y.o.   MRN: [id number]  HPI: Nathaniel Mills is a 52 y.o. male presenting on 06/28/2018 for Ankle Pain (Left. x 5 years and has gotten worse. Ankle surgery 2012. Patient states he dropped wood on his ankle and it has not been right ever sense ) and Fatigue (x 6 months)   HPI Left ankle pain Patient is coming in with left ankle pain that has been going on for 5 years but has worsened over the past couple months.  He says this was initiated when he was lifting some Timbers and dropped 1 and landed on his left ankle and it has been hurting him since then.  He has been trying anti-inflammatories and Tylenol to try and get it to improve but is just not improving.  He rates the pain is moderate to severe especially when he is up and moving around its worse.  He said he had an ankle surgery in 2012 and it just has not been the same since that time.  He denies any fevers or chills or redness or warmth or swelling but he does get swelling intermittently at times.  Fatigue Patient is coming in to discuss fatigue that has been an issue that he has been facing more over this past 6 months.  This patient denies any major issues with sleep although he does snore some but does not think he has apnea.  He has been having much decreased energy and drive to get up and do things.  He is coming in today because he wants to discuss possibility of why this could be going on.  He denies any chest pain or shortness of breath or wheezing.  He denies any issues with heat or cold.  He does complain of a little bit of decreased libido.  GERD Patient is currently on omeprazole.  She denies any major symptoms or abdominal pain or belching or burping. She denies any blood in her stool or lightheadedness or dizziness.   Relevant  past medical, surgical, family and social history reviewed and updated as indicated. Interim medical history since our last visit reviewed. Allergies and medications reviewed and updated.  Review of Systems  Constitutional: Positive for fatigue. Negative for chills and fever.  Eyes: Negative for discharge.  Respiratory: Negative for shortness of breath and wheezing.   Cardiovascular: Negative for chest pain and leg swelling.  Musculoskeletal: Positive for arthralgias. Negative for back pain, gait problem, joint swelling and myalgias.  Skin: Negative for rash.  Neurological: Negative for dizziness, weakness, light-headedness and headaches.  All other systems reviewed and are negative.   Per HPI unless specifically indicated above   Allergies as of 06/28/2018      Reactions   Penicillins Other (See Comments)   Had a seizure; occurred as an infant; along with sulfa   Sulfa Antibiotics Other (See Comments)   Had a seizure; occurred as an infant; along with pencillin      Medication List        Accurate as of 06/28/18  4:34 PM. Always use your most recent med list.          meloxicam 15 MG tablet Commonly known as:  MOBIC Take  1 tablet (15 mg total) by mouth daily.   naproxen sodium 220 MG tablet Commonly known as:  ALEVE Take 440 mg by mouth daily.   omeprazole 20 MG capsule Commonly known as:  PRILOSEC TAKE 1 CAPSULE BY MOUTH EVERY DAY   VISINE OP Place 2 drops into both eyes daily as needed (for dry eyes).          Objective:    BP 140/86   Pulse 90   Temp 98.5 F (36.9 C) (Oral)   Ht '5\' 11"'$  (1.803 m)   Wt 242 lb 9.6 oz (110 kg)   BMI 33.84 kg/m   Wt Readings from Last 3 Encounters:  06/28/18 242 lb 9.6 oz (110 kg)  01/14/18 230 lb (104.3 kg)  11/20/17 245 lb 2 oz (111.2 kg)    Physical Exam  Constitutional: He is oriented to person, place, and time. He appears well-developed and well-nourished. No distress.  Eyes: Conjunctivae are normal. No scleral  icterus.  Neck: Neck supple. No thyromegaly present.  Cardiovascular: Normal rate, regular rhythm, normal heart sounds and intact distal pulses.  No murmur heard. Pulmonary/Chest: Effort normal and breath sounds normal. No respiratory distress. He has no wheezes.  Abdominal: Soft. Bowel sounds are normal. He exhibits no distension. There is no tenderness. There is no guarding.  Musculoskeletal: Normal range of motion. He exhibits no edema.       Left ankle: He exhibits normal range of motion, no swelling and no deformity. Tenderness (Anterior and lateral tenderness).  Lymphadenopathy:    He has no cervical adenopathy.  Neurological: He is alert and oriented to person, place, and time. Coordination normal.  Skin: Skin is warm and dry. No rash noted. He is not diaphoretic.  Psychiatric: He has a normal mood and affect. His behavior is normal.  Nursing note and vitals reviewed.       Assessment & Plan:   Problem List Items Addressed This Visit      Digestive   Esophageal dysphagia   Relevant Medications   omeprazole (PRILOSEC) 20 MG capsule   Other Relevant Orders   CBC with Differential/Platelet (Completed)    Other Visit Diagnoses    Other fatigue    -  Primary   Relevant Orders   CBC with Differential/Platelet (Completed)   CMP14+EGFR (Completed)   TSH (Completed)   Chronic pain of left ankle       Likely osteoarthritis but does get red hot warm per patient sometimes, will test for uric acid and gout.  Sent meloxicam, also offered PT but patient will wait   Relevant Medications   meloxicam (MOBIC) 15 MG tablet   Other Relevant Orders   Uric acid (Completed)   Lipid screening       Relevant Orders   Lipid panel (Completed)   Prostate cancer screening       Relevant Orders   PSA, total and free (Completed)   Uric acid (Completed)       Follow up plan: Return if symptoms worsen or fail to improve.  Counseling provided for all of the vaccine components Orders Placed  This Encounter  Procedures  . CBC with Differential/Platelet  . CMP14+EGFR  . TSH  . Lipid panel  . PSA, total and free  . Uric acid    Caryl Pina, MD [facility] 06/28/2018, 4:34 PM

## 2018-06-29 LAB — CMP14+EGFR
ALT: 30 IU/L (ref 0–44)
AST: 16 IU/L (ref 0–40)
Albumin/Globulin Ratio: 2 (ref 1.2–2.2)
Albumin: 4.7 g/dL (ref 3.5–5.5)
Alkaline Phosphatase: 89 IU/L (ref 39–117)
BUN / CREAT RATIO: 17 (ref 9–20)
BUN: 15 mg/dL (ref 6–24)
Bilirubin Total: 0.6 mg/dL (ref 0.0–1.2)
CHLORIDE: 105 mmol/L (ref 96–106)
CO2: 23 mmol/L (ref 20–29)
Calcium: 9.3 mg/dL (ref 8.7–10.2)
Creatinine, Ser: 0.9 mg/dL (ref 0.76–1.27)
GFR calc non Af Amer: 97 mL/min/{1.73_m2} (ref >59)
GFR, EST AFRICAN AMERICAN: 112 mL/min/{1.73_m2} (ref >59)
Globulin, Total: 2.3 g/dL (ref 1.5–4.5)
Glucose: 102 mg/dL — ABNORMAL HIGH (ref 65–99)
Potassium: 4.1 mmol/L (ref 3.5–5.2)
Sodium: 143 mmol/L (ref 134–144)
TOTAL PROTEIN: 7 g/dL (ref 6.0–8.5)

## 2018-06-29 LAB — LIPID PANEL
CHOL/HDL RATIO: 5.2 ratio — AB (ref 0.0–5.0)
Cholesterol, Total: 177 mg/dL (ref 100–199)
HDL: 34 mg/dL — ABNORMAL LOW (ref >39)
LDL Calculated: 97 mg/dL (ref 0–99)
TRIGLYCERIDES: 228 mg/dL — AB (ref 0–149)
VLDL Cholesterol Cal: 46 mg/dL — ABNORMAL HIGH (ref 5–40)

## 2018-06-29 LAB — CBC WITH DIFFERENTIAL/PLATELET
BASOS: 1 %
Basophils Absolute: 0 10*3/uL (ref 0.0–0.2)
EOS (ABSOLUTE): 0.2 10*3/uL (ref 0.0–0.4)
Eos: 3 %
Hematocrit: 45.7 % (ref 37.5–51.0)
Hemoglobin: 16.2 g/dL (ref 13.0–17.7)
IMMATURE GRANS (ABS): 0.1 10*3/uL (ref 0.0–0.1)
Immature Granulocytes: 1 %
LYMPHS ABS: 2.5 10*3/uL (ref 0.7–3.1)
Lymphs: 31 %
MCH: 33.5 pg — AB (ref 26.6–33.0)
MCHC: 35.4 g/dL (ref 31.5–35.7)
MCV: 94 fL (ref 79–97)
MONOS ABS: 0.5 10*3/uL (ref 0.1–0.9)
Monocytes: 6 %
NEUTROS ABS: 4.9 10*3/uL (ref 1.4–7.0)
Neutrophils: 58 %
PLATELETS: 261 10*3/uL (ref 150–450)
RBC: 4.84 x10E6/uL (ref 4.14–5.80)
RDW: 13.4 % (ref 12.3–15.4)
WBC: 8.2 10*3/uL (ref 3.4–10.8)

## 2018-06-29 LAB — PSA, TOTAL AND FREE
PROSTATE SPECIFIC AG, SERUM: 0.9 ng/mL (ref 0.0–4.0)
PSA, Free Pct: 51.1 %
PSA, Free: 0.46 ng/mL

## 2018-06-29 LAB — URIC ACID: URIC ACID: 5.5 mg/dL (ref 3.7–8.6)

## 2018-06-29 LAB — TSH: TSH: 2.46 u[IU]/mL (ref 0.450–4.500)

## 2018-07-29 ENCOUNTER — Other Ambulatory Visit: Payer: Self-pay | Admitting: Family Medicine

## 2018-07-29 DIAGNOSIS — M25572 Pain in left ankle and joints of left foot: Principal | ICD-10-CM

## 2018-07-29 DIAGNOSIS — G8929 Other chronic pain: Secondary | ICD-10-CM

## 2018-07-29 NOTE — Telephone Encounter (Signed)
What is the name of the medication? meloxicam (MOBIC) 15 MG tablet [Pharmacy Med Name: MELOXICAM 15MG  needs 90 day supply per insurance  Have you contacted your pharmacy to request a refill? yes  Which pharmacy would you like this sent to? [facility]   Patient notified that their request is being sent to the clinical staff for review and that they should receive a call once it is complete. If they do not receive a call within 24 hours they can check with their pharmacy or our office.

## 2018-10-09 ENCOUNTER — Telehealth: Payer: Self-pay | Admitting: Family Medicine

## 2018-12-25 ENCOUNTER — Other Ambulatory Visit: Payer: Self-pay | Admitting: Family Medicine

## 2018-12-25 DIAGNOSIS — M25572 Pain in left ankle and joints of left foot: Principal | ICD-10-CM

## 2018-12-25 DIAGNOSIS — G8929 Other chronic pain: Secondary | ICD-10-CM

## 2018-12-25 NOTE — Telephone Encounter (Signed)
Last seen 06/28/18  Dr Dettinger

## 2019-02-03 ENCOUNTER — Telehealth: Payer: Self-pay | Admitting: Family Medicine

## 2019-02-03 DIAGNOSIS — M25572 Pain in left ankle and joints of left foot: Principal | ICD-10-CM

## 2019-02-03 DIAGNOSIS — G8929 Other chronic pain: Secondary | ICD-10-CM

## 2019-02-03 MED ORDER — MELOXICAM 15 MG PO TABS: 15.0000 mg | ORAL_TABLET | Freq: Every day | ORAL | 2 refills | Status: DC

## 2019-02-03 NOTE — Telephone Encounter (Signed)
Sent in meloxicam for the patient

## 2019-02-03 NOTE — Telephone Encounter (Signed)
Left message stating refill has been sent to pharmacy

## 2019-04-18 ENCOUNTER — Telehealth: Payer: Self-pay | Admitting: Family Medicine

## 2019-06-02 ENCOUNTER — Other Ambulatory Visit: Payer: Self-pay | Admitting: Family Medicine

## 2019-06-02 DIAGNOSIS — G8929 Other chronic pain: Secondary | ICD-10-CM

## 2019-06-02 DIAGNOSIS — M25572 Pain in left ankle and joints of left foot: Secondary | ICD-10-CM

## 2019-06-02 NOTE — Telephone Encounter (Signed)
Yes please have him schedule a f/u visit.

## 2019-06-02 NOTE — Telephone Encounter (Signed)
Last seen 06/2018. Do you want to approve 1 more month and NTBS?

## 2019-06-02 NOTE — Telephone Encounter (Signed)
LM - pt aware to call for appt - will be a year since seen - AUG 2019)

## 2019-06-23 ENCOUNTER — Other Ambulatory Visit: Payer: Self-pay

## 2019-06-24 ENCOUNTER — Encounter: Payer: Self-pay | Admitting: Family Medicine

## 2019-06-24 ENCOUNTER — Ambulatory Visit (INDEPENDENT_AMBULATORY_CARE_PROVIDER_SITE_OTHER): Payer: Managed Care, Other (non HMO) | Admitting: Family Medicine

## 2019-06-24 VITALS — BP 122/76 | HR 78 | Temp 99.1°F | Ht 71.0 in | Wt 206.0 lb

## 2019-06-24 DIAGNOSIS — K219 Gastro-esophageal reflux disease without esophagitis: Secondary | ICD-10-CM | POA: Diagnosis not present

## 2019-06-24 DIAGNOSIS — F17200 Nicotine dependence, unspecified, uncomplicated: Secondary | ICD-10-CM | POA: Diagnosis not present

## 2019-06-24 DIAGNOSIS — Z23 Encounter for immunization: Secondary | ICD-10-CM | POA: Diagnosis not present

## 2019-06-24 DIAGNOSIS — M5442 Lumbago with sciatica, left side: Secondary | ICD-10-CM | POA: Diagnosis not present

## 2019-06-24 DIAGNOSIS — G8929 Other chronic pain: Secondary | ICD-10-CM

## 2019-06-24 DIAGNOSIS — Z0001 Encounter for general adult medical examination with abnormal findings: Secondary | ICD-10-CM

## 2019-06-24 DIAGNOSIS — E782 Mixed hyperlipidemia: Secondary | ICD-10-CM

## 2019-06-24 DIAGNOSIS — Z125 Encounter for screening for malignant neoplasm of prostate: Secondary | ICD-10-CM

## 2019-06-24 DIAGNOSIS — Z0000 Encounter for general adult medical examination without abnormal findings: Secondary | ICD-10-CM

## 2019-06-24 DIAGNOSIS — M5441 Lumbago with sciatica, right side: Secondary | ICD-10-CM

## 2019-06-24 MED ORDER — OMEPRAZOLE 20 MG PO CPDR: DELAYED_RELEASE_CAPSULE | ORAL | 3 refills | Status: DC

## 2019-06-24 MED ORDER — DICLOFENAC SODIUM 75 MG PO TBEC: 75.0000 mg | DELAYED_RELEASE_TABLET | Freq: Two times a day (BID) | ORAL | 1 refills | Status: DC

## 2019-06-24 NOTE — Patient Instructions (Signed)

## 2019-06-24 NOTE — Progress Notes (Signed)
Nathaniel Mills is a 53 y.o. male presents to office today for annual physical exam examination.    Concerns today include: 1. Chronic low back pain Patient reports chronic low back pain.  He notes h/o spinal fusion in the lumbar spine.  He does not desire further surgical intervention.  He takes Mobic daily.  He also takes tylenol on occasion.  Wants to try something different as Mobic isn't as useful as it was before.  On PPI for GERD. No hx GI bleed or CKD.  Occasionally has N/T of LE when back pain flares.  No fecal incontinence, urinary retention, saddle anesthesia.  2. HLD Patient reports 50# weight loss (intentional).  He has been modifying diet, portions. No exercise.  He takes Krill oil but no statin.  Not fasting today.  Occupation: Furniture conservator/restorer, Marital status: Married, Substance use: tobacco use 1ppm 42 years, social ETOH Diet: reduced portions and more home cooking, Exercise: no structured activity Last eye exam: UTD, wears glasses Last dental exam: UTD Last colonoscopy: UTD Immunizations needed:TDap  Past Medical History:  Diagnosis Date  . Allergy   . Blood transfusion without reported diagnosis 2004  . GERD (gastroesophageal reflux disease) 2004-2012  . Hyperlipidemia    Social History   Socioeconomic History  . Marital status: Married    Spouse name: Not on file  . Number of children: Not on file  . Years of education: Not on file  . Highest education level: Not on file  Occupational History  . Not on file  Social Needs  . Financial resource strain: Not on file  . Food insecurity    Worry: Not on file    Inability: Not on file  . Transportation needs    Medical: Not on file    Non-medical: Not on file  Tobacco Use  . Smoking status: Current Every Day Smoker    Packs/day: 1.00    Years: 35.00    Pack years: 35.00    Types: Cigars  . Smokeless tobacco: Never Used  Substance and Sexual Activity  . Alcohol use: Yes    Alcohol/week: 6.0 standard  drinks    Types: 6 Cans of beer per week    Comment: 4 x a week  . Drug use: No  . Sexual activity: Yes  Lifestyle  . Physical activity    Days per week: Not on file    Minutes per session: Not on file  . Stress: Not on file  Relationships  . Social Herbalist on phone: Not on file    Gets together: Not on file    Attends religious service: Not on file    Active member of club or organization: Not on file    Attends meetings of clubs or organizations: Not on file    Relationship status: Not on file  . Intimate partner violence    Fear of current or ex partner: Not on file    Emotionally abused: Not on file    Physically abused: Not on file    Forced sexual activity: Not on file  Other Topics Concern  . Not on file  Social History Narrative  . Not on file   Past Surgical History:  Procedure Laterality Date  . ANKLE FRACTURE SURGERY Left 2009 & 2011  . COLONOSCOPY N/A 01/14/2018   Procedure: COLONOSCOPY;  Surgeon: Doran Stabler, MD;  Location: [facility];  Service: Gastroenterology;  Laterality: N/A;  . ESOPHAGOGASTRODUODENOSCOPY (EGD) WITH PROPOFOL N/A 01/14/2018  Procedure: ESOPHAGOGASTRODUODENOSCOPY (EGD) WITH PROPOFOL;  Surgeon: Doran Stabler, MD;  Location: [facility];  Service: Gastroenterology;  Laterality: N/A;  . SPINE SURGERY  2004   L4-6, fusion  . SPINE SURGERY  1995   c2 facture, halo fixation  . WISDOM TOOTH EXTRACTION     Family History  Problem Relation Age of Onset  . Heart disease Mother   . Hyperlipidemia Mother   . Hypertension Mother   . Arthritis Mother   . Alcohol abuse Mother   . Arthritis Father   . COPD Father   . Alcohol abuse Father   . Alcohol abuse Sister   . Alcohol abuse Brother   . Alcohol abuse Sister   . Colon cancer Neg Hx     Current Outpatient Medications:  .  meloxicam (MOBIC) 15 MG tablet, Take 1 tablet (15 mg total) by mouth daily. (Needs OV), Disp: 30 tablet, Rfl: 0 .  naproxen sodium (ANAPROX) 220  MG tablet, Take 440 mg by mouth daily. , Disp: , Rfl:  .  omeprazole (PRILOSEC) 20 MG capsule, TAKE 1 CAPSULE BY MOUTH EVERY DAY, Disp: 90 capsule, Rfl: 3 .  Tetrahydrozoline HCl (VISINE OP), Place 2 drops into both eyes daily as needed (for dry eyes)., Disp: , Rfl:   Allergies  Allergen Reactions  . Penicillins Other (See Comments)    Had a seizure; occurred as an infant; along with sulfa  . Sulfa Antibiotics Other (See Comments)    Had a seizure; occurred as an infant; along with pencillin     ROS: Review of Systems Constitutional: negative Eyes: negative Ears, nose, mouth, throat, and face: negative Respiratory: negative Cardiovascular: negative Gastrointestinal: positive for dyspepsia Genitourinary:negative Integument/breast: negative Hematologic/lymphatic: negative Musculoskeletal:positive for back pain Neurological: positive for occasional LE numbness/ tingling Behavioral/Psych: negative Endocrine: negative Allergic/Immunologic: negative    Physical exam BP 122/76   Pulse 78   Temp 99.1 F (37.3 C) (Temporal)   Ht _0  (1.803 m)   Wt 206 lb (93.4 kg)   BMI 28.73 kg/m  General appearance: alert, cooperative, appears stated age and no distress Head: Normocephalic, without obvious abnormality, atraumatic Eyes: negative findings: lids and lashes normal, conjunctivae and sclerae normal, corneas clear and pupils equal, round, reactive to light and accomodation Ears: normal TM's and external ear canals both ears Nose: Nares normal. Septum midline. Mucosa normal. No drainage or sinus tenderness. Throat: lips, mucosa, and tongue normal; teeth and gums normal Neck: no adenopathy, no carotid bruit, supple, symmetrical, trachea midline and thyroid not enlarged, symmetric, no tenderness/mass/nodules Back: Flattening of the lumbar spine noted with palpable postoperative changes. Lungs: clear to auscultation bilaterally Chest wall: no tenderness Heart: regular rate and  rhythm, S1, S2 normal, no murmur, click, rub or gallop Abdomen: soft, non-tender; bowel sounds normal; no masses,  no organomegaly Extremities: extremities normal, atraumatic, no cyanosis or edema Pulses: 2+ and symmetric Skin: Skin color, texture, turgor normal. No rashes or lesions Lymph nodes: Cervical, supraclavicular, and axillary nodes normal. Neurologic: Alert and oriented X 3, normal strength and tone. Normal symmetric reflexes. Normal coordination and gait Psych: Mood stable, speech normal, affect appropriate, pleasant and interactive Depression screen [facility] 2/9 06/24/2019 06/28/2018 07/03/2017  Decreased Interest 0 0 0  Down, Depressed, Hopeless 0 0 0  PHQ - 2 Score 0 0 0  Altered sleeping 0 - -  Tired, decreased energy 0 - -  Change in appetite 0 - -  Feeling bad or failure about yourself  0 - -  Trouble concentrating 0 - -  Moving slowly or fidgety/restless 0 - -  Suicidal thoughts 0 - -  PHQ-9 Score 0 - -   No flowsheet data found.  The 10-year ASCVD risk score Mikey Bussing DC Brooke Bonito., et al., 2013) is: 11%   Values used to calculate the score:     Age: 57 years     Sex: Male     Is Non-Hispanic African American: No     Diabetic: No     Tobacco smoker: Yes     Systolic Blood Pressure: 897 mmHg     Is BP treated: No     HDL Cholesterol: 34 mg/dL     Total Cholesterol: 177 mg/dL  Assessment/ Plan: Bonnye Fava here for annual physical exam.   1. Annual physical exam Has lost about 50 pounds intentionally since last visit.  He continues to work on lifestyle modification.  I congratulated him on this.  We will continue to work on tobacco cessation.  2. Gastroesophageal reflux disease without esophagitis Controlled with omeprazole.  This is been renewed - omeprazole (PRILOSEC) 20 MG capsule; TAKE 1 CAPSULE BY MOUTH EVERY DAY  Dispense: 90 capsule; Refill: 3 - CBC; Future  3. Chronic bilateral low back pain with bilateral sciatica Somewhat refractory to use of meloxicam.   Wishes to try alternative NSAID.  Voltaren prescribed - diclofenac (VOLTAREN) 75 MG EC tablet; Take 1 tablet (75 mg total) by mouth 2 (two) times daily. (if needed for back pain)  Dispense: 180 tablet; Refill: 1  4. Tobacco use disorder Contemplative.  We discussed various options for cessation.  He is also motivated by his work, who has several free options for him.  Should he desire to start Chantix, he will contact me - CBC; Future  5. Mixed hyperlipidemia He will come in for fasting labs tomorrow. Ok to continue [employer].  May need statin given last ASCVD risk 11%. - CMP14+EGFR; Future - Lipid panel; Future - TSH; Future  6. Screening for malignant neoplasm of prostate Asymptomatic.  Previously normal PSA.  Check with tomorrow's fasting labs. - PSA; Future   Counseled on healthy lifestyle choices, including diet (rich in fruits, vegetables and lean meats and low in salt and simple carbohydrates) and exercise (at least 30 minutes of moderate physical activity daily).  Patient to follow up in 1 year for annual exam or sooner if needed.  Ashly M. Lajuana Ripple, DO

## 2020-02-05 ENCOUNTER — Other Ambulatory Visit: Payer: Self-pay | Admitting: Family Medicine

## 2020-02-05 DIAGNOSIS — M5442 Lumbago with sciatica, left side: Secondary | ICD-10-CM

## 2020-02-05 DIAGNOSIS — G8929 Other chronic pain: Secondary | ICD-10-CM

## 2020-05-27 ENCOUNTER — Other Ambulatory Visit: Payer: Self-pay | Admitting: Family Medicine

## 2020-05-27 DIAGNOSIS — M5442 Lumbago with sciatica, left side: Secondary | ICD-10-CM

## 2020-05-27 DIAGNOSIS — G8929 Other chronic pain: Secondary | ICD-10-CM

## 2020-06-28 ENCOUNTER — Ambulatory Visit (INDEPENDENT_AMBULATORY_CARE_PROVIDER_SITE_OTHER): Payer: Managed Care, Other (non HMO) | Admitting: Family Medicine

## 2020-06-28 ENCOUNTER — Other Ambulatory Visit: Payer: Self-pay

## 2020-06-28 ENCOUNTER — Encounter: Payer: Self-pay | Admitting: Family Medicine

## 2020-06-28 VITALS — BP 104/64 | HR 86 | Temp 98.2°F | Ht 71.0 in | Wt 217.2 lb

## 2020-06-28 DIAGNOSIS — F17200 Nicotine dependence, unspecified, uncomplicated: Secondary | ICD-10-CM

## 2020-06-28 DIAGNOSIS — M549 Dorsalgia, unspecified: Secondary | ICD-10-CM

## 2020-06-28 DIAGNOSIS — Z0001 Encounter for general adult medical examination with abnormal findings: Secondary | ICD-10-CM

## 2020-06-28 DIAGNOSIS — K219 Gastro-esophageal reflux disease without esophagitis: Secondary | ICD-10-CM

## 2020-06-28 DIAGNOSIS — E782 Mixed hyperlipidemia: Secondary | ICD-10-CM

## 2020-06-28 DIAGNOSIS — Z9889 Other specified postprocedural states: Secondary | ICD-10-CM

## 2020-06-28 DIAGNOSIS — Z0000 Encounter for general adult medical examination without abnormal findings: Secondary | ICD-10-CM

## 2020-06-28 MED ORDER — ATORVASTATIN CALCIUM 20 MG PO TABS: 20.0000 mg | ORAL_TABLET | Freq: Every day | ORAL | 3 refills | Status: DC

## 2020-06-28 MED ORDER — OMEPRAZOLE 20 MG PO CPDR: DELAYED_RELEASE_CAPSULE | ORAL | 3 refills | Status: DC

## 2020-06-28 MED ORDER — IBUPROFEN 800 MG PO TABS: 800.0000 mg | ORAL_TABLET | Freq: Three times a day (TID) | ORAL | 3 refills | Status: DC | PRN

## 2020-06-28 NOTE — Patient Instructions (Addendum)
Your 10 year risk of stroke/ heart attack is 9 %  This is HIGHER than average risk and you should consider a cholesterol medication.  [facility] Address: [address], [place], [place] [address] Hours:  Open ? Closes 5PM Phone: [phone] Appointments: [email or web address]  [facility] Located in: [facility] Address: [address], [place], [place] [address] Hours:  Open ? Closes 5PM Phone: [phone] Appointments: [email or web address]  Richard D. Nelva Bush, MD COVID-19 info: [email or web address] Get online care: [email or web address] Address: [address], [place], [place] [address] Hours:  Open ? Closes [place]: Appointment required  Mask required  Staff wear masks  More details Phone: [phone] Appointments: [email or web address]   Preventive Care 55-27 Years Old, Male Preventive care refers to lifestyle choices and visits with your health care provider that can promote health and wellness. This includes:  A yearly physical exam. This is also called an annual well check.  Regular dental and eye exams.  Immunizations.  Screening for certain conditions.  Healthy lifestyle choices, such as eating a healthy diet, getting regular exercise, not using drugs or products that contain nicotine and tobacco, and limiting alcohol use. What can I expect for my preventive care visit? Physical exam Your health care provider will check:  Height and weight. These may be used to calculate body mass index (BMI), which is a measurement that tells if you are at a healthy weight.  Heart rate and blood pressure.  Your skin for abnormal spots. Counseling Your health care provider may ask you questions about:  Alcohol, tobacco, and drug use.  Emotional well-being.  Home and relationship well-being.  Sexual activity.  Eating habits.  Work and work Statistician. What immunizations do I need?  Influenza  (flu) vaccine  This is recommended every year. Tetanus, diphtheria, and pertussis (Tdap) vaccine  You may need a Td booster every 10 years. Varicella (chickenpox) vaccine  You may need this vaccine if you have not already been vaccinated. Zoster (shingles) vaccine  You may need this after age 41. Measles, mumps, and rubella (MMR) vaccine  You may need at least one dose of MMR if you were born in 1957 or later. You may also need a second dose. Pneumococcal conjugate (PCV13) vaccine  You may need this if you have certain conditions and were not previously vaccinated. Pneumococcal polysaccharide (PPSV23) vaccine  You may need one or two doses if you smoke cigarettes or if you have certain conditions. Meningococcal conjugate (MenACWY) vaccine  You may need this if you have certain conditions. Hepatitis A vaccine  You may need this if you have certain conditions or if you travel or work in places where you may be exposed to hepatitis A. Hepatitis B vaccine  You may need this if you have certain conditions or if you travel or work in places where you may be exposed to hepatitis B. Haemophilus influenzae type b (Hib) vaccine  You may need this if you have certain risk factors. Human papillomavirus (HPV) vaccine  If recommended by your health care provider, you may need three doses over 6 months. You may receive vaccines as individual doses or as more than one vaccine together in one shot (combination vaccines). Talk with your health care provider about the risks and benefits of combination vaccines. What tests do I need? Blood tests  Lipid and cholesterol levels. These may be checked every 5 years, or more frequently if you  are over 6 years old.  Hepatitis C test.  Hepatitis B test. Screening  Lung cancer screening. You may have this screening every year starting at age 82 if you have a 30-pack-year history of smoking and currently smoke or have quit within the past 15  years.  Prostate cancer screening. Recommendations will vary depending on your family history and other risks.  Colorectal cancer screening. All adults should have this screening starting at age 2 and continuing until age 8. Your health care provider may recommend screening at age 71 if you are at increased risk. You will have tests every 1-10 years, depending on your results and the type of screening test.  Diabetes screening. This is done by checking your blood sugar (glucose) after you have not eaten for a while (fasting). You may have this done every 1-3 years.  Sexually transmitted disease (STD) testing. Follow these instructions at home: Eating and drinking  Eat a diet that includes fresh fruits and vegetables, whole grains, lean protein, and low-fat dairy products.  Take vitamin and mineral supplements as recommended by your health care provider.  Do not drink alcohol if your health care provider tells you not to drink.  If you drink alcohol: ? Limit how much you have to 0-2 drinks a day. ? Be aware of how much alcohol is in your drink. In the [place] one drink equals one 12 oz bottle of beer (355 mL), one 5 oz glass of wine (148 mL), or one 1 oz glass of hard liquor (44 mL). Lifestyle  Take daily care of your teeth and gums.  Stay active. Exercise for at least 30 minutes on 5 or more days each week.  Do not use any products that contain nicotine or tobacco, such as cigarettes, e-cigarettes, and chewing tobacco. If you need help quitting, ask your health care provider.  If you are sexually active, practice safe sex. Use a condom or other form of protection to prevent STIs (sexually transmitted infections).  Talk with your health care provider about taking a low-dose aspirin every day starting at age 36. What's next?  Go to your health care provider once a year for a well check visit.  Ask your health care provider how often you should have your eyes and teeth  checked.  Stay up to date on all vaccines. This information is not intended to replace advice given to you by your health care provider. Make sure you discuss any questions you have with your health care provider. Document Revised: 10/24/2018 Document Reviewed: 10/24/2018 Elsevier Patient Education  2020 [employer].

## 2020-06-28 NOTE — Progress Notes (Signed)
Nathaniel Mills is a 54 y.o. male presents to office today for annual physical exam examination.    Concerns today include: 1.  Low back pain with radicular symptoms in lower extremities Patient reports greater than 3-year history of low back pain with associated radicular symptoms of bilateral lower extremities.  However, it seems to be getting worse.  He notes numbness and tingling that is worse with standing but relieved by bending or sitting.  The numbness and tingling starts at the back of his thigh and radiates down to the lower extremities bilaterally.  He cites a recent event where he was outside using lawn equipment and for some reason he developed an erection.  His back was aggravated during this event and he wonders if the 2 were connected to see was not aroused while working.  He has history of lumbar fusion in the lower spine in [place].  That physician has since retired.  3 years ago he had a CT myelogram performed and saw a doctor in [place] for evaluation.  He was told at that time no surgical interventions were available to him given history of previous fusion.  He was encouraged to stretch to improve symptoms.  He admits that he does not stretch as much as he should.  He takes Motrin 800 mg once symptoms are very bad and this does seem to help some.  Unfortunately, his job does entail quite a bit of sitting.  Last colonoscopy: UTD Refills needed today:  Immunizations needed: COVID  Past Medical History:  Diagnosis Date  . Allergy   . Blood transfusion without reported diagnosis 2004  . GERD (gastroesophageal reflux disease) 2004-2012  . Hyperlipidemia    Social History   Socioeconomic History  . Marital status: Married    Spouse name: Not on file  . Number of children: Not on file  . Years of education: Not on file  . Highest education level: Not on file  Occupational History  . Not on file  Tobacco Use  . Smoking status: Current Every Day Smoker     Packs/day: 1.00    Years: 35.00    Pack years: 35.00    Types: Cigars  . Smokeless tobacco: Never Used  Vaping Use  . Vaping Use: Never used  Substance and Sexual Activity  . Alcohol use: Yes    Alcohol/week: 6.0 standard drinks    Types: 6 Cans of beer per week    Comment: 4 x a week  . Drug use: No  . Sexual activity: Yes  Other Topics Concern  . Not on file  Social History Narrative  . Not on file   Social Determinants of Health   Financial Resource Strain:   . Difficulty of Paying Living Expenses:   Food Insecurity:   . Worried About Charity fundraiser in the Last Year:   . Arboriculturist in the Last Year:   Transportation Needs:   . Film/video editor (Medical):   Marland Kitchen Lack of Transportation (Non-Medical):   Physical Activity:   . Days of Exercise per Week:   . Minutes of Exercise per Session:   Stress:   . Feeling of Stress :   Social Connections:   . Frequency of Communication with Friends and Family:   . Frequency of Social Gatherings with Friends and Family:   . Attends Religious Services:   . Active Member of Clubs or Organizations:   . Attends Archivist Meetings:   Marland Kitchen Marital  Status:   Intimate Partner Violence:   . Fear of Current or Ex-Partner:   . Emotionally Abused:   Marland Kitchen Physically Abused:   . Sexually Abused:    Past Surgical History:  Procedure Laterality Date  . ANKLE FRACTURE SURGERY Left 2009 & 2011  . COLONOSCOPY N/A 01/14/2018   Procedure: COLONOSCOPY;  Surgeon: Doran Stabler, MD;  Location: [facility];  Service: Gastroenterology;  Laterality: N/A;  . ESOPHAGOGASTRODUODENOSCOPY (EGD) WITH PROPOFOL N/A 01/14/2018   Procedure: ESOPHAGOGASTRODUODENOSCOPY (EGD) WITH PROPOFOL;  Surgeon: Doran Stabler, MD;  Location: [facility];  Service: Gastroenterology;  Laterality: N/A;  . SPINE SURGERY  2004   L4-6, fusion  . SPINE SURGERY  1995   c2 facture, halo fixation  . WISDOM TOOTH EXTRACTION     Family History  Problem  Relation Age of Onset  . Heart disease Mother   . Hyperlipidemia Mother   . Hypertension Mother   . Arthritis Mother   . Alcohol abuse Mother   . Arthritis Father   . COPD Father   . Alcohol abuse Father   . Alcohol abuse Sister   . Alcohol abuse Brother   . Alcohol abuse Sister   . Colon cancer Neg Hx     Current Outpatient Medications:  .  diclofenac (VOLTAREN) 75 MG EC tablet, TAKE 1 TABLET BY MOUTH TWICE DAILY AS NEEDED FOR BACK PAIN, Disp: 180 tablet, Rfl: 0 .  Krill Oil 1000 MG CAPS, Take by mouth., Disp: , Rfl:  .  omeprazole (PRILOSEC) 20 MG capsule, TAKE 1 CAPSULE BY MOUTH EVERY DAY, Disp: 90 capsule, Rfl: 3 .  Tetrahydrozoline HCl (VISINE OP), Place 2 drops into both eyes daily as needed (for dry eyes)., Disp: , Rfl:   Allergies  Allergen Reactions  . Penicillins Other (See Comments)    Had a seizure; occurred as an infant; along with sulfa  . Sulfa Antibiotics Other (See Comments)    Had a seizure; occurred as an infant; along with pencillin     ROS: Review of Systems Pertinent items noted in HPI and remainder of comprehensive ROS otherwise negative.    Physical exam BP 104/64   Pulse 86   Temp 98.2 F (36.8 C)   Ht '5\' 11"'$  (1.803 m)   Wt 217 lb 3.2 oz (98.5 kg)   SpO2 98%   BMI 30.29 kg/m  General appearance: alert, cooperative, appears stated age and no distress Head: Normocephalic, without obvious abnormality, atraumatic Eyes: negative findings: lids and lashes normal, conjunctivae and sclerae normal, corneas clear and pupils equal, round, reactive to light and accomodation Ears: normal TM's and external ear canals both ears Nose: Nares normal. Septum midline. Mucosa normal. No drainage or sinus tenderness. Throat: lips, mucosa, and tongue normal; teeth and gums normal Neck: no adenopathy, no carotid bruit, supple, symmetrical, trachea midline and thyroid not enlarged, symmetric, no tenderness/mass/nodules Back: symmetric, no curvature. ROM normal. No  CVA tenderness.;  Palpable postsurgical changes in the lumbar spine Lungs: clear to auscultation bilaterally Chest wall: no tenderness Heart: regular rate and rhythm, S1, S2 normal, no murmur, click, rub or gallop Abdomen: soft, non-tender; bowel sounds normal; no masses,  no organomegaly Extremities: extremities normal, atraumatic, no cyanosis or edema Pulses: 2+ and symmetric Skin: Skin color, texture, turgor normal. No rashes or lesions Lymph nodes: Cervical, supraclavicular, and axillary nodes normal. Neurologic: Grossly normal; patellar DTRs 1 out of 4 bilaterally; decreased light touch sensation along the lateral aspect of the left calf Psych:  Mood stable, speech normal, affect appropriate  Depression screen [facility] 2/9 06/28/2020 06/24/2019 06/28/2018  Decreased Interest 0 0 0  Down, Depressed, Hopeless 0 0 0  PHQ - 2 Score 0 0 0  Altered sleeping - 0 -  Tired, decreased energy - 0 -  Change in appetite - 0 -  Feeling bad or failure about yourself  - 0 -  Trouble concentrating - 0 -  Moving slowly or fidgety/restless - 0 -  Suicidal thoughts - 0 -  PHQ-9 Score - 0 -   Assessment/ Plan: Bonnye Fava here for annual physical exam.   1. Annual physical exam  2. Gastroesophageal reflux disease without esophagitis Stable - omeprazole (PRILOSEC) 20 MG capsule; TAKE 1 CAPSULE BY MOUTH EVERY DAY  Dispense: 90 capsule; Refill: 3  3. Mixed hyperlipidemia I reviewed his cholesterol labs from work which showed a total cholesterol 182, HDL 35, LDL 121, triglycerides 129.  ASCVD risk score of 9% in the next 10 years.  Start statin.  Recheck fasting lipid panel and LFTs in 3 months - CMP14+EGFR; Future - TSH; Future - atorvastatin (LIPITOR) 20 MG tablet; Take 1 tablet (20 mg total) by mouth daily.  Dispense: 90 tablet; Refill: 3 - Hepatic Function Panel; Future - Lipid panel; Future  4. Tobacco use disorder  5. Back pain with history of spinal surgery Advil renewed as this seems to  work better than the diclofenac.  Okay to use Tylenol but no other NSAIDs.  He is going to look into the spinal surgeons in [place] and let me know if he needs a referral and/or CT myelogram (he has history of hardware that is not compatible with MRI).  Previously seen by orthopedics in [place].  Encourage stretches as recommended. - ibuprofen (ADVIL) 800 MG tablet; Take 1 tablet (800 mg total) by mouth every 8 (eight) hours as needed.  Dispense: 90 tablet; Refill: 3    Counseled on healthy lifestyle choices, including diet (rich in fruits, vegetables and lean meats and low in salt and simple carbohydrates) and exercise (at least 30 minutes of moderate physical activity daily).  Patient to follow up in 1 year for annual exam or sooner if needed.  Denea Cheaney M. Lajuana Ripple, DO

## 2020-06-29 ENCOUNTER — Other Ambulatory Visit: Payer: Self-pay | Admitting: Family Medicine

## 2020-06-29 DIAGNOSIS — E782 Mixed hyperlipidemia: Secondary | ICD-10-CM

## 2020-06-29 LAB — LIPID PANEL
Chol/HDL Ratio: 5.1 ratio — ABNORMAL HIGH (ref 0.0–5.0)
Cholesterol, Total: 195 mg/dL (ref 100–199)
HDL: 38 mg/dL — ABNORMAL LOW (ref >39)
LDL Chol Calc (NIH): 136 mg/dL — ABNORMAL HIGH (ref 0–99)
Triglycerides: 113 mg/dL (ref 0–149)
VLDL Cholesterol Cal: 21 mg/dL (ref 5–40)

## 2020-06-29 LAB — CMP14+EGFR
ALT: 37 IU/L (ref 0–44)
AST: 27 IU/L (ref 0–40)
Albumin/Globulin Ratio: 2 (ref 1.2–2.2)
Albumin: 4.9 g/dL (ref 3.8–4.9)
Alkaline Phosphatase: 91 IU/L (ref 48–121)
BUN/Creatinine Ratio: 18 (ref 9–20)
BUN: 17 mg/dL (ref 6–24)
Bilirubin Total: 0.6 mg/dL (ref 0.0–1.2)
CO2: 23 mmol/L (ref 20–29)
Calcium: 9.6 mg/dL (ref 8.7–10.2)
Chloride: 103 mmol/L (ref 96–106)
Creatinine, Ser: 0.97 mg/dL (ref 0.76–1.27)
GFR calc Af Amer: 101 mL/min/{1.73_m2} (ref >59)
GFR calc non Af Amer: 88 mL/min/{1.73_m2} (ref >59)
Globulin, Total: 2.4 g/dL (ref 1.5–4.5)
Glucose: 87 mg/dL (ref 65–99)
Potassium: 4.4 mmol/L (ref 3.5–5.2)
Sodium: 140 mmol/L (ref 134–144)
Total Protein: 7.3 g/dL (ref 6.0–8.5)

## 2020-06-29 LAB — TSH: TSH: 2.09 u[IU]/mL (ref 0.450–4.500)

## 2020-06-29 LAB — HEPATIC FUNCTION PANEL: Bilirubin, Direct: 0.18 mg/dL (ref 0.00–0.40)

## 2020-06-29 NOTE — Progress Notes (Signed)
Apparently his liver function panel was released yesterday.  This was intended to be repeated in 3 months, not incorporated with yesterday's labs.  I discussed this with Tesh with [facility] and she will have this fixed.  I have placed another liver function panel to be performed in 3 months along with his repeat fasting lipid

## 2020-07-16 ENCOUNTER — Telehealth: Payer: Self-pay | Admitting: Family Medicine

## 2020-07-16 NOTE — Telephone Encounter (Signed)
Pt aware that Dr Darnell Level is off until 07/20/20. We will call him back

## 2020-07-19 NOTE — Telephone Encounter (Signed)
No blood tests outside of normal dementia work up that I know of.  Typically don't run unless patient actually has memory loss because they aren't useful without symptoms.  However, sometimes genetics/ neuro can run genetic testing for Alzheimer's specifically.  Of course, this is only one form of dementia.  Lewy body, vascular, etc are others.  If he wants a referral to genetics, I can place.

## 2020-07-20 NOTE — Telephone Encounter (Signed)
Patients wife aware she is going to speak with husband and see what he would like to do. And call us back

## 2020-09-09 ENCOUNTER — Other Ambulatory Visit: Payer: Self-pay | Admitting: Family Medicine

## 2020-09-09 DIAGNOSIS — G8929 Other chronic pain: Secondary | ICD-10-CM

## 2020-09-09 DIAGNOSIS — M5442 Lumbago with sciatica, left side: Secondary | ICD-10-CM

## 2020-09-14 ENCOUNTER — Other Ambulatory Visit: Payer: Self-pay | Admitting: Family Medicine

## 2020-09-14 ENCOUNTER — Telehealth: Payer: Self-pay

## 2020-09-14 DIAGNOSIS — Z9889 Other specified postprocedural states: Secondary | ICD-10-CM

## 2020-09-14 DIAGNOSIS — M549 Dorsalgia, unspecified: Secondary | ICD-10-CM

## 2020-09-14 DIAGNOSIS — G8929 Other chronic pain: Secondary | ICD-10-CM

## 2020-09-14 DIAGNOSIS — M5442 Lumbago with sciatica, left side: Secondary | ICD-10-CM

## 2020-09-14 NOTE — Telephone Encounter (Signed)
He was supposed to tell me where he wanted to go. [place] or [place]?

## 2020-09-14 NOTE — Telephone Encounter (Signed)
REFERRAL REQUEST Telephone Note  Have you been seen at our office for this problem? yes (Advise that they may need an appointment with their PCP before a referral can be done)  Reason for Referral: neurology Referral discussed with patient: yes with dr g at last visit Best contact number of patient for referral team: [phone]    Has patient been seen by a specialist for this issue before:no  Patient provider preference for referral: na Patient location preference for referral: na   Patient notified that referrals can take up to a week or longer to process. If they haven't heard anything within a week they should call back and speak with the referral department.

## 2020-09-14 NOTE — Telephone Encounter (Signed)
Called patient he would like to go to [place].

## 2020-09-15 ENCOUNTER — Other Ambulatory Visit: Payer: Self-pay | Admitting: Family Medicine

## 2020-09-15 DIAGNOSIS — G8929 Other chronic pain: Secondary | ICD-10-CM

## 2020-09-15 NOTE — Telephone Encounter (Signed)
  Prescription Request  09/15/2020  What is the name of the medication or equipment? diclofenac (VOLTAREN) 75 MG EC tablet    Have you contacted your pharmacy to request a refill? (if applicable) yes  Which pharmacy would you like this sent to? [facility]    Patient notified that their request is being sent to the clinical staff for review and that they should receive a response within 2 business days.

## 2020-09-28 ENCOUNTER — Other Ambulatory Visit: Payer: Self-pay

## 2020-09-28 ENCOUNTER — Ambulatory Visit (INDEPENDENT_AMBULATORY_CARE_PROVIDER_SITE_OTHER): Payer: Managed Care, Other (non HMO) | Admitting: Family Medicine

## 2020-09-28 ENCOUNTER — Encounter: Payer: Self-pay | Admitting: Family Medicine

## 2020-09-28 VITALS — BP 127/78 | HR 82 | Temp 98.2°F | Ht 71.0 in | Wt 225.0 lb

## 2020-09-28 DIAGNOSIS — E782 Mixed hyperlipidemia: Secondary | ICD-10-CM | POA: Diagnosis not present

## 2020-09-28 NOTE — Patient Instructions (Signed)

## 2020-09-28 NOTE — Progress Notes (Signed)
Subjective: CC: Follow-up hyperlipidemia PCP: Janora Norlander, DO DPO:EUMPNTI Guevara is a 54 y.o. male presenting to clinic today for:  1.  Hyperlipidemia Patient was started on Lipitor 20 mg daily.  Previous ASCVD risk score was greater than 7-1/2%.  He reports compliance with the medication.  He did hold it for 1 week as he was having some vivid dreams.  He was not sure if it was related to the medication but he was able to restart it has not had any problems.  Currently taking during the daytime.  No increase in myalgia.  No abdominal pain.  No chest pain or shortness of breath.  He is not fasting today.  The 10-year ASCVD risk score Nathaniel Mills DC Brooke Bonito., et al., 2013) is: 12.6%   Values used to calculate the score:     Age: 63 years     Sex: Male     Is Non-Hispanic African American: No     Diabetic: No     Tobacco smoker: Yes     Systolic Blood Pressure: 144 mmHg     Is BP treated: No     HDL Cholesterol: 38 mg/dL     Total Cholesterol: 195 mg/dL    ROS: Per HPI  Allergies  Allergen Reactions  . Penicillins Other (See Comments)    Had a seizure; occurred as an infant; along with sulfa  . Sulfa Antibiotics Other (See Comments)    Had a seizure; occurred as an infant; along with pencillin   Past Medical History:  Diagnosis Date  . Allergy   . Blood transfusion without reported diagnosis 2004  . GERD (gastroesophageal reflux disease) 2004-2012  . Hyperlipidemia     Current Outpatient Medications:  .  atorvastatin (LIPITOR) 20 MG tablet, Take 1 tablet (20 mg total) by mouth daily., Disp: 90 tablet, Rfl: 3 .  diclofenac (VOLTAREN) 75 MG EC tablet, TAKE 1 TABLET BY MOUTH TWICE DAILY AS NEEDED FOR BACK PAIN, Disp: 180 tablet, Rfl: 0 .  ibuprofen (ADVIL) 800 MG tablet, Take 1 tablet (800 mg total) by mouth every 8 (eight) hours as needed., Disp: 90 tablet, Rfl: 3 .  omeprazole (PRILOSEC) 20 MG capsule, TAKE 1 CAPSULE BY MOUTH EVERY DAY, Disp: 90 capsule, Rfl: 3 .   Tetrahydrozoline HCl (VISINE OP), Place 2 drops into both eyes daily as needed (for dry eyes)., Disp: , Rfl:  Social History   Socioeconomic History  . Marital status: Married    Spouse name: Not on file  . Number of children: Not on file  . Years of education: Not on file  . Highest education level: Not on file  Occupational History  . Not on file  Tobacco Use  . Smoking status: Current Every Day Smoker    Packs/day: 1.00    Years: 35.00    Pack years: 35.00    Types: Cigars  . Smokeless tobacco: Never Used  Vaping Use  . Vaping Use: Never used  Substance and Sexual Activity  . Alcohol use: Yes    Alcohol/week: 6.0 standard drinks    Types: 6 Cans of beer per week    Comment: 4 x a week  . Drug use: No  . Sexual activity: Yes  Other Topics Concern  . Not on file  Social History Narrative  . Not on file   Social Determinants of Health   Financial Resource Strain:   . Difficulty of Paying Living Expenses: Not on file  Food Insecurity:   . Worried  About Running Out of Food in the Last Year: Not on file  . Ran Out of Food in the Last Year: Not on file  Transportation Needs:   . Lack of Transportation (Medical): Not on file  . Lack of Transportation (Non-Medical): Not on file  Physical Activity:   . Days of Exercise per Week: Not on file  . Minutes of Exercise per Session: Not on file  Stress:   . Feeling of Stress : Not on file  Social Connections:   . Frequency of Communication with Friends and Family: Not on file  . Frequency of Social Gatherings with Friends and Family: Not on file  . Attends Religious Services: Not on file  . Active Member of Clubs or Organizations: Not on file  . Attends Archivist Meetings: Not on file  . Marital Status: Not on file  Intimate Partner Violence:   . Fear of Current or Ex-Partner: Not on file  . Emotionally Abused: Not on file  . Physically Abused: Not on file  . Sexually Abused: Not on file   Family History    Problem Relation Age of Onset  . Heart disease Mother   . Hyperlipidemia Mother   . Hypertension Mother   . Arthritis Mother   . Alcohol abuse Mother   . Arthritis Father   . COPD Father   . Alcohol abuse Father   . Alcohol abuse Sister   . Alcohol abuse Brother   . Alcohol abuse Sister   . Colon cancer Neg Hx     Objective: Office vital signs reviewed. BP 127/78   Pulse 82   Temp 98.2 F (36.8 C) (Temporal)   Ht 5\' 11"  (1.803 m)   Wt 225 lb (102.1 kg)   SpO2 98%   BMI 31.38 kg/m   Physical Examination:  General: Awake, alert, well nourished, No acute distress HEENT: Normal; sclera white. Cardio: regular rate and rhythm, S1S2 heard, no murmurs appreciated Pulm: clear to auscultation bilaterally, no wheezes, rhonchi or rales; normal work of breathing on room air GI: Flat, nondistended.  No right upper quadrant tenderness  Assessment/ Plan: 54 y.o. male   1. Mixed hyperlipidemia Not fasting.  Wants to proceed with lipid panel and LFTs regardless.  Was only off of statin for 1 week - Lipid panel - Hepatic function panel   No orders of the defined types were placed in this encounter.  No orders of the defined types were placed in this encounter.    Janora Norlander, DO [place] [phone]

## 2020-09-29 LAB — HEPATIC FUNCTION PANEL
ALT: 31 IU/L (ref 0–44)
AST: 21 IU/L (ref 0–40)
Albumin: 4.9 g/dL (ref 3.8–4.9)
Alkaline Phosphatase: 91 IU/L (ref 44–121)
Bilirubin Total: 0.6 mg/dL (ref 0.0–1.2)
Bilirubin, Direct: 0.17 mg/dL (ref 0.00–0.40)
Total Protein: 7.2 g/dL (ref 6.0–8.5)

## 2020-09-29 LAB — LIPID PANEL
Chol/HDL Ratio: 3.6 ratio (ref 0.0–5.0)
Cholesterol, Total: 130 mg/dL (ref 100–199)
HDL: 36 mg/dL — ABNORMAL LOW (ref >39)
LDL Chol Calc (NIH): 72 mg/dL (ref 0–99)
Triglycerides: 122 mg/dL (ref 0–149)
VLDL Cholesterol Cal: 22 mg/dL (ref 5–40)

## 2020-12-17 ENCOUNTER — Telehealth: Payer: Self-pay

## 2020-12-17 NOTE — Telephone Encounter (Signed)
Left message to call back  

## 2020-12-21 ENCOUNTER — Ambulatory Visit (INDEPENDENT_AMBULATORY_CARE_PROVIDER_SITE_OTHER): Payer: Managed Care, Other (non HMO) | Admitting: Family Medicine

## 2020-12-21 ENCOUNTER — Other Ambulatory Visit: Payer: Self-pay

## 2020-12-21 ENCOUNTER — Encounter: Payer: Self-pay | Admitting: Family Medicine

## 2020-12-21 VITALS — BP 128/71 | HR 72 | Temp 97.9°F | Ht 71.0 in | Wt 228.0 lb

## 2020-12-21 DIAGNOSIS — F5101 Primary insomnia: Secondary | ICD-10-CM | POA: Diagnosis not present

## 2020-12-21 DIAGNOSIS — I700 Atherosclerosis of aorta: Secondary | ICD-10-CM | POA: Diagnosis not present

## 2020-12-21 DIAGNOSIS — M549 Dorsalgia, unspecified: Secondary | ICD-10-CM | POA: Diagnosis not present

## 2020-12-21 DIAGNOSIS — Z9889 Other specified postprocedural states: Secondary | ICD-10-CM

## 2020-12-21 MED ORDER — TRAZODONE HCL 50 MG PO TABS: 25.0000 mg | ORAL_TABLET | Freq: Every evening | ORAL | 3 refills | Status: DC | PRN

## 2020-12-21 NOTE — Telephone Encounter (Signed)
Patient had office visit today to discuss with Dr. Lajuana Ripple

## 2020-12-21 NOTE — Progress Notes (Signed)
Subjective: CC: insomnia PCP: Janora Norlander, DO OIN:[facility] Nathaniel Mills is a 55 y.o. male presenting to clinic today for:  1.  Insomnia Patient reports that he has been having issues with insomnia for the last several months.  He has been using up to 20 mg of melatonin at bedtime but is not really had any improvement in sleep.  He is able to fall asleep around 930 or 10:00 each evening but often wakes up around 3 or 4 each morning and is not able to get back to restful sleep before he is supposed to get up at 7 AM.  He often will not be able to shut off his mind and therefore cannot go to sleep.  He denies panic disorder.  2.  Back pain with radicular symptoms Patient with ongoing back pain with radicular symptoms in the setting of previous spinal surgery.  He was recently seen by the back specialist and CT myelogram has been ordered.  He will be having this done pending where he can find a costly image.  This has not yet been scheduled.  He is asking for handicap placard today  3.  Aortic atherosclerosis Patient was found to have abdominal aortic atherosclerosis on imaging.  He is asking what exactly this means.  He is currently treated with Lipitor.  Not requiring any blood pressure medication or medication for sugar.  No reports of chest pain, shortness of breath.  No change in exercise tolerance except for as limited by musculoskeletal concerns   ROS: Per HPI  Allergies  Allergen Reactions  . Penicillins Other (See Comments)    Had a seizure; occurred as an infant; along with sulfa  . Sulfa Antibiotics Other (See Comments)    Had a seizure; occurred as an infant; along with pencillin   Past Medical History:  Diagnosis Date  . Allergy   . Blood transfusion without reported diagnosis 2004  . GERD (gastroesophageal reflux disease) 2004-2012  . Hyperlipidemia     Current Outpatient Medications:  .  atorvastatin (LIPITOR) 20 MG tablet, Take 1 tablet (20 mg total) by mouth daily.,  Disp: 90 tablet, Rfl: 3 .  diclofenac (VOLTAREN) 75 MG EC tablet, TAKE 1 TABLET BY MOUTH TWICE DAILY AS NEEDED FOR BACK PAIN, Disp: 180 tablet, Rfl: 0 .  ibuprofen (ADVIL) 800 MG tablet, Take 1 tablet (800 mg total) by mouth every 8 (eight) hours as needed., Disp: 90 tablet, Rfl: 3 .  omeprazole (PRILOSEC) 20 MG capsule, TAKE 1 CAPSULE BY MOUTH EVERY DAY, Disp: 90 capsule, Rfl: 3 .  Tetrahydrozoline HCl (VISINE OP), Place 2 drops into both eyes daily as needed (for dry eyes)., Disp: , Rfl:  Social History   Socioeconomic History  . Marital status: Married    Spouse name: Not on file  . Number of children: Not on file  . Years of education: Not on file  . Highest education level: Not on file  Occupational History  . Not on file  Tobacco Use  . Smoking status: Current Every Day Smoker    Packs/day: 1.00    Years: 35.00    Pack years: 35.00    Types: Cigars  . Smokeless tobacco: Never Used  Vaping Use  . Vaping Use: Never used  Substance and Sexual Activity  . Alcohol use: Yes    Alcohol/week: 6.0 standard drinks    Types: 6 Cans of beer per week    Comment: 4 x a week  . Drug use: No  .  Sexual activity: Yes  Other Topics Concern  . Not on file  Social History Narrative  . Not on file   Social Determinants of Health   Financial Resource Strain: Not on file  Food Insecurity: Not on file  Transportation Needs: Not on file  Physical Activity: Not on file  Stress: Not on file  Social Connections: Not on file  Intimate Partner Violence: Not on file   Family History  Problem Relation Age of Onset  . Heart disease Mother   . Hyperlipidemia Mother   . Hypertension Mother   . Arthritis Mother   . Alcohol abuse Mother   . Arthritis Father   . COPD Father   . Alcohol abuse Father   . Alcohol abuse Sister   . Alcohol abuse Brother   . Alcohol abuse Sister   . Colon cancer Neg Hx     Objective: Office vital signs reviewed. BP 128/71   Pulse 72   Temp 97.9 F (36.6  C)   Ht 5\' 11"  (1.803 m)   Wt 228 lb (103.4 kg)   SpO2 99%   BMI 31.80 kg/m   Physical Examination:  General: Awake, alert, well nourished, No acute distress HEENT: Normal; sclera white Cardio: regular rate  Pulm: Normal work of breathing on room air MSK: Ambulating independently. Psych: Mood is stable, speech is normal, affect is appropriate Depression screen [facility] 2/9 12/21/2020 09/28/2020 06/28/2020  Decreased Interest 0 0 0  Down, Depressed, Hopeless 0 0 0  PHQ - 2 Score 0 0 0  Altered sleeping - 0 -  Tired, decreased energy - 0 -  Change in appetite - 0 -  Feeling bad or failure about yourself  - 0 -  Trouble concentrating - 0 -  Moving slowly or fidgety/restless - 0 -  Suicidal thoughts - 0 -  PHQ-9 Score - 0 -   Assessment/ Plan: 55 y.o. male   Back pain with history of spinal surgery  Abdominal aortic atherosclerosis (HCC)  Primary insomnia - Plan: traZODone (DESYREL) 50 MG tablet  Continue to follow-up with spinal specialist.  Handicap placard form was completed for the patient today.  This has been given for 6 months but we certainly can extend if needed going forward  Reviewed the abdominal aortic atherosclerosis is often reflective of hyperlipidemia and is a cardiovascular risk.  He is to continue statin, physical activity and proper diet.  We will continue to monitor cholesterol, blood pressure and sugar closely.  With regards to insomnia, I am trialing him on trazodone.  We discussed alternative Ambien but discussed that this often has more side effects in does require more in person visits due to the controlled nature of the medication.  We can plan to follow-up as needed or in August for his annual physical exam  No orders of the defined types were placed in this encounter.  No orders of the defined types were placed in this encounter.    Janora Norlander, DO [place] [phone]

## 2021-04-01 ENCOUNTER — Other Ambulatory Visit: Payer: Self-pay | Admitting: Family Medicine

## 2021-04-01 DIAGNOSIS — M549 Dorsalgia, unspecified: Secondary | ICD-10-CM

## 2021-04-01 DIAGNOSIS — Z9889 Other specified postprocedural states: Secondary | ICD-10-CM

## 2021-04-01 DIAGNOSIS — F5101 Primary insomnia: Secondary | ICD-10-CM

## 2021-05-18 ENCOUNTER — Other Ambulatory Visit: Payer: Self-pay | Admitting: Family Medicine

## 2021-05-18 DIAGNOSIS — F5101 Primary insomnia: Secondary | ICD-10-CM

## 2021-06-05 ENCOUNTER — Other Ambulatory Visit: Payer: Self-pay | Admitting: Family Medicine

## 2021-06-05 DIAGNOSIS — M549 Dorsalgia, unspecified: Secondary | ICD-10-CM

## 2021-06-15 ENCOUNTER — Encounter: Payer: Self-pay | Admitting: Family Medicine

## 2021-06-15 ENCOUNTER — Ambulatory Visit (INDEPENDENT_AMBULATORY_CARE_PROVIDER_SITE_OTHER): Payer: Managed Care, Other (non HMO) | Admitting: Family Medicine

## 2021-06-15 DIAGNOSIS — M549 Dorsalgia, unspecified: Secondary | ICD-10-CM

## 2021-06-15 DIAGNOSIS — Z9889 Other specified postprocedural states: Secondary | ICD-10-CM

## 2021-06-15 DIAGNOSIS — M5442 Lumbago with sciatica, left side: Secondary | ICD-10-CM

## 2021-06-15 DIAGNOSIS — G8929 Other chronic pain: Secondary | ICD-10-CM

## 2021-06-15 DIAGNOSIS — F17200 Nicotine dependence, unspecified, uncomplicated: Secondary | ICD-10-CM | POA: Diagnosis not present

## 2021-06-15 DIAGNOSIS — M5441 Lumbago with sciatica, right side: Secondary | ICD-10-CM

## 2021-06-15 MED ORDER — GABAPENTIN 300 MG PO CAPS: ORAL_CAPSULE | ORAL | 0 refills | Status: DC

## 2021-06-15 NOTE — Progress Notes (Signed)
Telephone visit  Subjective: [id number] pain PCP: Janora Norlander, DO [phone]:[id number] Nathaniel Mills is a 55 y.o. male calls for telephone consult today. Patient provides verbal consent for consult held via phone.  Due to COVID-19 pandemic this visit was conducted virtually. This visit type was conducted due to national recommendations for restrictions regarding the COVID-19 Pandemic (e.g. social distancing, sheltering in place) in an effort to limit this patient's exposure and mitigate transmission in our community. All issues noted in this document were discussed and addressed.  A physical exam was not performed with this format.   Location of patient: home Location of provider: [facility] Others present for call: Amy  1. Chronic back pain Was scheduled for surgery 6/20 but [employer] would not cover surgery.  He needs to be smoke free for 6 months and needs a blood test to prove that.  He denies any exacerbation in back pain.  It is no better nor no worse.  He really would like to pursue alternatives to surgery and feels that this hiccup with insurance was likely a sign not to get it.  He has considered injection therapy and even medication for treatment of his back pain.  He is not currently on gabapentin but would be amenable to starting.  He really does not want to be on opioids or anything addictive unless absolutely necessary.  ROS: Per HPI  Allergies  Allergen Reactions   Penicillins Other (See Comments)    Had a seizure; occurred as an infant; along with sulfa   Sulfa Antibiotics Other (See Comments)    Had a seizure; occurred as an infant; along with pencillin   Past Medical History:  Diagnosis Date   Allergy    Blood transfusion without reported diagnosis 2004   GERD (gastroesophageal reflux disease) 2004-2012   Hyperlipidemia     Current Outpatient Medications:    atorvastatin (LIPITOR) 20 MG tablet, Take 1 tablet (20 mg total) by mouth daily., Disp: 90 tablet, Rfl: 3   ibuprofen (ADVIL)  800 MG tablet, TAKE 1 TABLET BY MOUTH EVERY 8 HOURS AS NEEDED, Disp: 90 tablet, Rfl: 2   omeprazole (PRILOSEC) 20 MG capsule, TAKE 1 CAPSULE BY MOUTH EVERY DAY, Disp: 90 capsule, Rfl: 3   Tetrahydrozoline HCl (VISINE OP), Place 2 drops into both eyes daily as needed (for dry eyes)., Disp: , Rfl:    traZODone (DESYREL) 50 MG tablet, TAKE 1/2 TO 1 & 1/2 TABLET BY MOUTH AT BEDTIME AS NEEDED FOR SLEEP, Disp: 45 tablet, Rfl: 0  Assessment/ Plan: 55 y.o. male   Back pain with history of spinal surgery - Plan: gabapentin (NEURONTIN) 300 MG capsule, Ambulatory referral to Physical Medicine Rehab  Chronic bilateral low back pain with bilateral sciatica - Plan: gabapentin (NEURONTIN) 300 MG capsule, Ambulatory referral to Physical Medicine Rehab  Tobacco use disorder  Trial of gabapentin.  Discussed possible side effects.  Gradual taper over the next few weeks.  Follow-up visit scheduled.  Referral to physical medicine rehabilitation center.  Again, patient is looking for alternatives to surgery and really does not want to pursue any opioid treatments if this can be avoided.    Start time: 10:20am End time: 10:32am  Total time spent on patient care (including telephone call/ virtual visit): 12 minutes  [place], [place] [phone]

## 2021-06-23 ENCOUNTER — Encounter: Payer: Self-pay | Admitting: Physical Medicine & Rehabilitation

## 2021-06-27 ENCOUNTER — Encounter: Payer: Self-pay | Admitting: Gastroenterology

## 2021-06-30 ENCOUNTER — Encounter: Payer: Self-pay | Admitting: Family Medicine

## 2021-06-30 ENCOUNTER — Other Ambulatory Visit: Payer: Self-pay

## 2021-06-30 ENCOUNTER — Ambulatory Visit (INDEPENDENT_AMBULATORY_CARE_PROVIDER_SITE_OTHER): Payer: Managed Care, Other (non HMO) | Admitting: Family Medicine

## 2021-06-30 VITALS — BP 132/81 | HR 70 | Temp 97.9°F | Ht 71.0 in | Wt 208.6 lb

## 2021-06-30 DIAGNOSIS — M549 Dorsalgia, unspecified: Secondary | ICD-10-CM | POA: Diagnosis not present

## 2021-06-30 DIAGNOSIS — I700 Atherosclerosis of aorta: Secondary | ICD-10-CM

## 2021-06-30 DIAGNOSIS — R079 Chest pain, unspecified: Secondary | ICD-10-CM | POA: Diagnosis not present

## 2021-06-30 DIAGNOSIS — E782 Mixed hyperlipidemia: Secondary | ICD-10-CM

## 2021-06-30 DIAGNOSIS — Z9889 Other specified postprocedural states: Secondary | ICD-10-CM | POA: Diagnosis not present

## 2021-06-30 DIAGNOSIS — F17200 Nicotine dependence, unspecified, uncomplicated: Secondary | ICD-10-CM

## 2021-06-30 DIAGNOSIS — Z0000 Encounter for general adult medical examination without abnormal findings: Secondary | ICD-10-CM

## 2021-06-30 DIAGNOSIS — F5101 Primary insomnia: Secondary | ICD-10-CM

## 2021-06-30 DIAGNOSIS — N3943 Post-void dribbling: Secondary | ICD-10-CM

## 2021-06-30 DIAGNOSIS — M5442 Lumbago with sciatica, left side: Secondary | ICD-10-CM

## 2021-06-30 DIAGNOSIS — Q139 Congenital malformation of anterior segment of eye, unspecified: Secondary | ICD-10-CM

## 2021-06-30 DIAGNOSIS — K219 Gastro-esophageal reflux disease without esophagitis: Secondary | ICD-10-CM

## 2021-06-30 DIAGNOSIS — G8929 Other chronic pain: Secondary | ICD-10-CM

## 2021-06-30 DIAGNOSIS — Z0001 Encounter for general adult medical examination with abnormal findings: Secondary | ICD-10-CM

## 2021-06-30 DIAGNOSIS — Z1159 Encounter for screening for other viral diseases: Secondary | ICD-10-CM

## 2021-06-30 DIAGNOSIS — M5441 Lumbago with sciatica, right side: Secondary | ICD-10-CM

## 2021-06-30 MED ORDER — OMEPRAZOLE 20 MG PO CPDR: DELAYED_RELEASE_CAPSULE | ORAL | 3 refills | Status: DC

## 2021-06-30 MED ORDER — ATORVASTATIN CALCIUM 20 MG PO TABS: 20.0000 mg | ORAL_TABLET | Freq: Every day | ORAL | 3 refills | Status: DC

## 2021-06-30 MED ORDER — GABAPENTIN 300 MG PO CAPS: 300.0000 mg | ORAL_CAPSULE | Freq: Three times a day (TID) | ORAL | 99 refills | Status: DC

## 2021-06-30 MED ORDER — TRAZODONE HCL 50 MG PO TABS: 50.0000 mg | ORAL_TABLET | Freq: Every evening | ORAL | 3 refills | Status: DC | PRN

## 2021-06-30 NOTE — Patient Instructions (Signed)
You had labs performed today.  You will be contacted with the results of the labs once they are available, usually in the next 3 business days for routine lab work.  If you have an active my chart account, they will be released to your MyChart.  If you prefer to have these labs released to you via telephone, please let us know.  We talked about low dose CT scan for lung cancer screening.  You qualify given 41 pack year history.  Preventive Care 67-35 Years Old, Male Preventive care refers to lifestyle choices and visits with your health care provider that can promote health and wellness. This includes: A yearly physical exam. This is also called an annual wellness visit. Regular dental and eye exams. Immunizations. Screening for certain conditions. Healthy lifestyle choices, such as: Eating a healthy diet. Getting regular exercise. Not using drugs or products that contain nicotine and tobacco. Limiting alcohol use. What can I expect for my preventive care visit? Physical exam Your health care provider will check your: Height and weight. These may be used to calculate your BMI (body mass index). BMI is a measurement that tells if you are at a healthy weight. Heart rate and blood pressure. Body temperature. Skin for abnormal spots. Counseling Your health care provider may ask you questions about your: Past medical problems. Family's medical history. Alcohol, tobacco, and drug use. Emotional well-being. Home life and relationship well-being. Sexual activity. Diet, exercise, and sleep habits. Work and work Statistician. Access to firearms. What immunizations do I need?  Vaccines are usually given at various ages, according to a schedule. Your health care provider will recommend vaccines for you based on your age, medicalhistory, and lifestyle or other factors, such as travel or where you work. What tests do I need? Blood tests Lipid and cholesterol levels. These may be checked every  5 years, or more often if you are over 32 years old. Hepatitis C test. Hepatitis B test. Screening Lung cancer screening. You may have this screening every year starting at age 14 if you have a 30-pack-year history of smoking and currently smoke or have quit within the past 15 years. Prostate cancer screening. Recommendations will vary depending on your family history and other risks. Genital exam to check for testicular cancer or hernias. Colorectal cancer screening. All adults should have this screening starting at age 53 and continuing until age 52. Your health care provider may recommend screening at age 51 if you are at increased risk. You will have tests every 1-10 years, depending on your results and the type of screening test. Diabetes screening. This is done by checking your blood sugar (glucose) after you have not eaten for a while (fasting). You may have this done every 1-3 years. STD (sexually transmitted disease) testing, if you are at risk. Follow these instructions at home: Eating and drinking  Eat a diet that includes fresh fruits and vegetables, whole grains, lean protein, and low-fat dairy products. Take vitamin and mineral supplements as recommended by your health care provider. Do not drink alcohol if your health care provider tells you not to drink. If you drink alcohol: Limit how much you have to 0-2 drinks a day. Be aware of how much alcohol is in your drink. In the [place] one drink equals one 12 oz bottle of beer (355 mL), one 5 oz glass of wine (148 mL), or one 1 oz glass of hard liquor (44 mL).  Lifestyle Take daily care of your teeth and gums.  Brush your teeth every morning and night with fluoride toothpaste. Floss one time each day. Stay active. Exercise for at least 30 minutes 5 or more days each week. Do not use any products that contain nicotine or tobacco, such as cigarettes, e-cigarettes, and chewing tobacco. If you need help quitting, ask your health care  provider. Do not use drugs. If you are sexually active, practice safe sex. Use a condom or other form of protection to prevent STIs (sexually transmitted infections). If told by your health care provider, take low-dose aspirin daily starting at age 52. Find healthy ways to cope with stress, such as: Meditation, yoga, or listening to music. Journaling. Talking to a trusted person. Spending time with friends and family. Safety Always wear your seat belt while driving or riding in a vehicle. Do not drive: If you have been drinking alcohol. Do not ride with someone who has been drinking. When you are tired or distracted. While texting. Wear a helmet and other protective equipment during sports activities. If you have firearms in your house, make sure you follow all gun safety procedures. What's next? Go to your health care provider once a year for an annual wellness visit. Ask your health care provider how often you should have your eyes and teeth checked. Stay up to date on all vaccines. This information is not intended to replace advice given to you by your health care provider. Make sure you discuss any questions you have with your healthcare provider. Document Revised: 07/29/2019 Document Reviewed: 10/24/2018 Elsevier Patient Education  2022 [employer].

## 2021-06-30 NOTE — Progress Notes (Signed)
Nathaniel Mills is a 55 y.o. male presents to office today for annual physical exam examination.    Concerns today include: 1. Back pain w/ h/o back surgery He does report very mild improvement in back pain with the gabapentin.  Initially the gabapentin did cause some drowsiness but he seems to be acclimating to that and will be planning to advance dose to 3 times daily.  No reports of swelling.  Continues to have intermittent numbness of the legs.  Has an appointment scheduled with physical medicine and rehabilitation  2.  chest pain Patient reports that he had some chest pain when he was working out in the yard recently.  He denies any associated shortness of breath or nausea.  He is an active every day smoker and has been so for over 4 years now.  He currently smokes 1 pack/day.  No hemoptysis, unplanned weight loss, change in exercise tolerance or breathing.  3.  Insomnia Patient reports good relief of symptoms with 1 tablet of trazodone daily.  Needs refills on this.  Marital status: married, Substance use: tobacco daily, occasional ETOH Diet: fair, Exercise: tries to stay active as able.  Back pain interferes Last eye exam: UTD Last dental exam: UTD Last colonoscopy: wants cologuard Refills needed today: as above Immunizations needed:declines Shingles Immunization History  Administered Date(s) Administered   Tdap 06/24/2019     Past Medical History:  Diagnosis Date   Allergy    Blood transfusion without reported diagnosis 2004   GERD (gastroesophageal reflux disease) 2004-2012   Hyperlipidemia    Social History   Socioeconomic History   Marital status: Married    Spouse name: Not on file   Number of children: Not on file   Years of education: Not on file   Highest education level: Not on file  Occupational History   Not on file  Tobacco Use   Smoking status: Every Day    Packs/day: 1.00    Years: 35.00    Pack years: 35.00    Types: Cigars, Cigarettes    Smokeless tobacco: Never  Vaping Use   Vaping Use: Never used  Substance and Sexual Activity   Alcohol use: Yes    Alcohol/week: 6.0 standard drinks    Types: 6 Cans of beer per week    Comment: 4 x a week   Drug use: No   Sexual activity: Yes  Other Topics Concern   Not on file  Social History Narrative   Not on file   Social Determinants of Health   Financial Resource Strain: Not on file  Food Insecurity: Not on file  Transportation Needs: Not on file  Physical Activity: Not on file  Stress: Not on file  Social Connections: Not on file  Intimate Partner Violence: Not on file   Past Surgical History:  Procedure Laterality Date   ANKLE FRACTURE SURGERY Left 2009 & 2011   COLONOSCOPY N/A 01/14/2018   Procedure: COLONOSCOPY;  Surgeon: Sherrilyn Rist, MD;  Location: [facility];  Service: Gastroenterology;  Laterality: N/A;   ESOPHAGOGASTRODUODENOSCOPY (EGD) WITH PROPOFOL N/A 01/14/2018   Procedure: ESOPHAGOGASTRODUODENOSCOPY (EGD) WITH PROPOFOL;  Surgeon: Sherrilyn Rist, MD;  Location: [facility];  Service: Gastroenterology;  Laterality: N/A;   SPINE SURGERY  2004   L4-6, fusion   SPINE SURGERY  1995   c2 facture, halo fixation   WISDOM TOOTH EXTRACTION     Family History  Problem Relation Age of Onset   Heart disease Mother  Hyperlipidemia Mother    Hypertension Mother    Arthritis Mother    Alcohol abuse Mother    Arthritis Father    COPD Father    Alcohol abuse Father    Alcohol abuse Sister    Alcohol abuse Brother    Alcohol abuse Sister    Colon cancer Neg Hx     Current Outpatient Medications:    atorvastatin (LIPITOR) 20 MG tablet, Take 1 tablet (20 mg total) by mouth daily., Disp: 90 tablet, Rfl: 3   gabapentin (NEURONTIN) 300 MG capsule, Take 1 capsule (300 mg total) by mouth at bedtime for 7 days, THEN 1 capsule (300 mg total) 2 (two) times daily for 7 days, THEN 1 capsule (300 mg total) 3 (three) times daily for 21 days., Disp: 90 capsule,  Rfl: 0   ibuprofen (ADVIL) 800 MG tablet, TAKE 1 TABLET BY MOUTH EVERY 8 HOURS AS NEEDED, Disp: 90 tablet, Rfl: 2   omeprazole (PRILOSEC) 20 MG capsule, TAKE 1 CAPSULE BY MOUTH EVERY DAY, Disp: 90 capsule, Rfl: 3   Tetrahydrozoline HCl (VISINE OP), Place 2 drops into both eyes daily as needed (for dry eyes)., Disp: , Rfl:    traZODone (DESYREL) 50 MG tablet, TAKE 1/2 TO 1 & 1/2 TABLET BY MOUTH AT BEDTIME AS NEEDED FOR SLEEP, Disp: 45 tablet, Rfl: 0  Allergies  Allergen Reactions   Penicillins Other (See Comments)    Had a seizure; occurred as an infant; along with sulfa   Sulfa Antibiotics Other (See Comments)    Had a seizure; occurred as an infant; along with pencillin     ROS: Review of Systems A comprehensive review of systems was negative except for: Cardiovascular: positive for chest pain Gastrointestinal: positive for constipation Genitourinary: positive for dribbling    Physical exam BP 132/81   Pulse 70   Temp 97.9 F (36.6 C)   Ht $R'5\' 11"'mB$  (1.803 m)   Wt 208 lb 9.6 oz (94.6 kg)   SpO2 100%   BMI 29.09 kg/m  General appearance: alert, cooperative, appears stated age, and no distress Head: Normocephalic, without obvious abnormality, atraumatic Eyes:  Sclera white but he does have a scleral lesion noted along the right eye (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 Septum midline. Mucosa normal. No drainage or sinus tenderness. Throat: lips, mucosa, and tongue normal; teeth and gums normal Neck: no adenopathy, no carotid bruit, supple, symmetrical, trachea midline, and thyroid not enlarged, symmetric, no tenderness/mass/nodules Back: symmetric, no curvature. ROM normal. No CVA tenderness. Lungs: clear to auscultation bilaterally Chest wall: no tenderness Heart: regular rate and rhythm, S1, S2 normal, no murmur, click, rub or gallop Abdomen: soft, non-tender; bowel sounds normal; no  masses,  no organomegaly Extremities: extremities normal, atraumatic, no cyanosis or edema Pulses: 2+ and symmetric Skin: Skin color, texture, turgor normal. No rashes or lesions Lymph nodes: Cervical, supraclavicular, and axillary nodes normal. Neurologic: Grossly normal Psych: mood stable, speech normal. Depression screen [facility] 2/9 06/30/2021 12/21/2020 09/28/2020  Decreased Interest 0 0 0  Down, Depressed, Hopeless 0 0 0  PHQ - 2 Score 0 0 0  Altered sleeping - - 0  Tired, decreased energy - - 0  Change in appetite - - 0  Feeling bad or failure about yourself  - - 0  Trouble concentrating - - 0  Moving slowly or fidgety/restless - - 0  Suicidal thoughts - - 0  PHQ-9 Score - - 0   GAD 7 : Generalized Anxiety Score 06/30/2021  Nervous, Anxious, on Edge 0  Control/stop worrying 0  Worry too much - different things 0  Trouble relaxing 0  Restless 0  Easily annoyed or irritable 0  Afraid - awful might happen 0  Total GAD 7 Score 0  Anxiety Difficulty Not difficult at all   Assessment/ Plan: Nathaniel Mills here for annual physical exam.   Annual physical exam  Chest pain, unspecified type - Plan: EKG 12-Lead, Ambulatory referral to Cardiology  Mixed hyperlipidemia - Plan: atorvastatin (LIPITOR) 20 MG tablet, Ambulatory referral to Cardiology  Abdominal aortic atherosclerosis ([place]) - Plan: CMP14+EGFR, Lipid Panel, TSH, Ambulatory referral to Cardiology  Tobacco use disorder - Plan: CBC with Differential, Ambulatory referral to Cardiology  Chronic bilateral low back pain with bilateral sciatica - Plan: gabapentin (NEURONTIN) 300 MG capsule  Back pain with history of spinal surgery - Plan: gabapentin (NEURONTIN) 300 MG capsule  Post-void dribbling - Plan: PSA  Anomaly of sclera  Primary insomnia - Plan: traZODone (DESYREL) 50 MG tablet  Gastroesophageal reflux disease without esophagitis - Plan: omeprazole (PRILOSEC) 20 MG capsule  Encounter for hepatitis C screening test  for low risk patient - Plan: Hepatitis C antibody  EKG without evidence of overt ischemia.  However, he has many risk factors and I think that he would benefit from stress testing.  We discussed need for smoking cessation and he would like to quit cold [place] but really needs a support of his wife, who also smokes.  I am checking fasting labs on this gentleman and we will plan to continue statin at current dose for now.  He will follow-up with physical medicine rehabilitation as prescribed and we will continue the gabapentin.  This has been renewed  Check PSA given postvoid dribbling.  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  He has no visual disturbances  Insomnia stable with trazodone.  This has been renewed  GERD stable with Prilosec.  This has been renewed  Counseled on healthy lifestyle choices, including diet (rich in fruits, vegetables and lean meats and low in salt and simple carbohydrates) and exercise (at least 30 minutes of moderate physical activity daily).  Patient to follow up in 1 year for annual exam or sooner if needed.  Makenleigh Crownover M. Lajuana Ripple, DO

## 2021-07-01 ENCOUNTER — Other Ambulatory Visit: Payer: Managed Care, Other (non HMO)

## 2021-07-01 DIAGNOSIS — I700 Atherosclerosis of aorta: Secondary | ICD-10-CM

## 2021-07-01 DIAGNOSIS — Z1159 Encounter for screening for other viral diseases: Secondary | ICD-10-CM

## 2021-07-01 DIAGNOSIS — F17200 Nicotine dependence, unspecified, uncomplicated: Secondary | ICD-10-CM

## 2021-07-01 DIAGNOSIS — N3943 Post-void dribbling: Secondary | ICD-10-CM

## 2021-07-02 LAB — CMP14+EGFR
ALT: 22 IU/L (ref 0–44)
AST: 19 IU/L (ref 0–40)
Albumin/Globulin Ratio: 2.2 (ref 1.2–2.2)
Albumin: 4.8 g/dL (ref 3.8–4.9)
Alkaline Phosphatase: 93 IU/L (ref 44–121)
BUN/Creatinine Ratio: 19 (ref 9–20)
BUN: 15 mg/dL (ref 6–24)
Bilirubin Total: 0.7 mg/dL (ref 0.0–1.2)
CO2: 23 mmol/L (ref 20–29)
Calcium: 9.5 mg/dL (ref 8.7–10.2)
Chloride: 103 mmol/L (ref 96–106)
Creatinine, Ser: 0.8 mg/dL (ref 0.76–1.27)
Globulin, Total: 2.2 g/dL (ref 1.5–4.5)
Glucose: 105 mg/dL — ABNORMAL HIGH (ref 65–99)
Potassium: 4.9 mmol/L (ref 3.5–5.2)
Sodium: 141 mmol/L (ref 134–144)
Total Protein: 7 g/dL (ref 6.0–8.5)
eGFR: 104 mL/min/{1.73_m2} (ref >59)

## 2021-07-02 LAB — CBC WITH DIFFERENTIAL/PLATELET
Basophils Absolute: 0.1 10*3/uL (ref 0.0–0.2)
Basos: 1 %
EOS (ABSOLUTE): 0.2 10*3/uL (ref 0.0–0.4)
Eos: 1 %
Hematocrit: 46.5 % (ref 37.5–51.0)
Hemoglobin: 16.3 g/dL (ref 13.0–17.7)
Immature Grans (Abs): 0.1 10*3/uL (ref 0.0–0.1)
Immature Granulocytes: 1 %
Lymphocytes Absolute: 2 10*3/uL (ref 0.7–3.1)
Lymphs: 16 %
MCH: 33.1 pg — ABNORMAL HIGH (ref 26.6–33.0)
MCHC: 35.1 g/dL (ref 31.5–35.7)
MCV: 94 fL (ref 79–97)
Monocytes Absolute: 0.6 10*3/uL (ref 0.1–0.9)
Monocytes: 5 %
Neutrophils Absolute: 9.4 10*3/uL — ABNORMAL HIGH (ref 1.4–7.0)
Neutrophils: 76 %
Platelets: 225 10*3/uL (ref 150–450)
RBC: 4.93 x10E6/uL (ref 4.14–5.80)
RDW: 12.6 % (ref 11.6–15.4)
WBC: 12.3 10*3/uL — ABNORMAL HIGH (ref 3.4–10.8)

## 2021-07-02 LAB — TSH: TSH: 1.96 u[IU]/mL (ref 0.450–4.500)

## 2021-07-02 LAB — LIPID PANEL
Chol/HDL Ratio: 3.1 ratio (ref 0.0–5.0)
Cholesterol, Total: 122 mg/dL (ref 100–199)
HDL: 39 mg/dL — ABNORMAL LOW (ref >39)
LDL Chol Calc (NIH): 70 mg/dL (ref 0–99)
Triglycerides: 58 mg/dL (ref 0–149)
VLDL Cholesterol Cal: 13 mg/dL (ref 5–40)

## 2021-07-02 LAB — HEPATITIS C ANTIBODY: Hep C Virus Ab: <0.1 s/co ratio (ref 0.0–0.9)

## 2021-07-02 LAB — PSA: Prostate Specific Ag, Serum: 1 ng/mL (ref 0.0–4.0)

## 2021-07-12 ENCOUNTER — Ambulatory Visit: Payer: Managed Care, Other (non HMO) | Admitting: Family Medicine

## 2021-07-19 ENCOUNTER — Other Ambulatory Visit: Payer: Self-pay | Admitting: Family Medicine

## 2021-07-19 ENCOUNTER — Ambulatory Visit: Payer: Managed Care, Other (non HMO) | Admitting: Family Medicine

## 2021-07-19 DIAGNOSIS — M549 Dorsalgia, unspecified: Secondary | ICD-10-CM

## 2021-07-19 DIAGNOSIS — M5441 Lumbago with sciatica, right side: Secondary | ICD-10-CM

## 2021-07-19 DIAGNOSIS — Z9889 Other specified postprocedural states: Secondary | ICD-10-CM

## 2021-07-19 DIAGNOSIS — G8929 Other chronic pain: Secondary | ICD-10-CM

## 2021-07-19 DIAGNOSIS — K219 Gastro-esophageal reflux disease without esophagitis: Secondary | ICD-10-CM

## 2021-07-21 ENCOUNTER — Encounter: Payer: Self-pay | Admitting: Family Medicine

## 2021-08-15 ENCOUNTER — Telehealth: Payer: Self-pay | Admitting: Family Medicine

## 2021-08-15 NOTE — Telephone Encounter (Signed)
No vm

## 2021-08-15 NOTE — Telephone Encounter (Signed)
Reduce to BID x1 week, then once daily x1 week then may stop.

## 2021-08-15 NOTE — Telephone Encounter (Signed)
PT wants to let Lajuana Ripple know that gabapentin (NEURONTIN) 300 MG capsule is not working. He is having issues concentrating and feels lumpy after taking it.  He does not want to just STOP taking all at once. What are the recommendations on stopping the med? Please call back

## 2021-08-17 ENCOUNTER — Encounter: Payer: Self-pay | Admitting: Cardiology

## 2021-08-17 ENCOUNTER — Ambulatory Visit (INDEPENDENT_AMBULATORY_CARE_PROVIDER_SITE_OTHER): Payer: Managed Care, Other (non HMO) | Admitting: Cardiology

## 2021-08-17 ENCOUNTER — Encounter: Payer: Self-pay | Admitting: *Deleted

## 2021-08-17 VITALS — BP 136/84 | HR 72 | Ht 71.0 in | Wt 215.6 lb

## 2021-08-17 DIAGNOSIS — R079 Chest pain, unspecified: Secondary | ICD-10-CM | POA: Diagnosis not present

## 2021-08-17 MED ORDER — ASPIRIN EC 81 MG PO TBEC: 81.0000 mg | DELAYED_RELEASE_TABLET | Freq: Every day | ORAL | 3 refills | Status: DC

## 2021-08-17 NOTE — Progress Notes (Signed)
Clinical Summary Nathaniel Mills is a 55 y.o.male seen today as a new consult, referred by Dr Lajuana Ripple for the following medical problems.   1.Chest pain - symptoms started about 6 months ago - pressure midchest, 3/10 in severity. No other associated symptoms. Mainly occurs with activity. Comes on with raking, using mower. Does not have to stop.  - not positional  -adbominal aortic atherosclerosis - EKG with pcp showed SR, iRBBB  CAD risk factors: hyperlipidemia, tobacco x 30 years, mother MI 13s, older sister MI early 88s - chronic leg pains, back pain    Past Medical History:  Diagnosis Date   Allergy    Blood transfusion without reported diagnosis 2004   GERD (gastroesophageal reflux disease) 2004-2012   Hyperlipidemia      Allergies  Allergen Reactions   Penicillins Other (See Comments)    Had a seizure; occurred as an infant; along with sulfa   Sulfa Antibiotics Other (See Comments)    Had a seizure; occurred as an infant; along with pencillin     Current Outpatient Medications  Medication Sig Dispense Refill   atorvastatin (LIPITOR) 20 MG tablet Take 1 tablet (20 mg total) by mouth daily. 90 tablet 3   gabapentin (NEURONTIN) 300 MG capsule Take 1 capsule (300 mg total) by mouth 3 (three) times daily. 90 capsule PRN   ibuprofen (ADVIL) 800 MG tablet TAKE 1 TABLET BY MOUTH EVERY 8 HOURS AS NEEDED 90 tablet 2   omeprazole (PRILOSEC) 20 MG capsule TAKE 1 CAPSULE BY MOUTH EVERY DAY 90 capsule 3   Tetrahydrozoline HCl (VISINE OP) Place 2 drops into both eyes daily as needed (for dry eyes).     traZODone (DESYREL) 50 MG tablet Take 1-1.5 tablets (50-75 mg total) by mouth at bedtime as needed for sleep. 135 tablet 3   No current facility-administered medications for this visit.     Past Surgical History:  Procedure Laterality Date   ANKLE FRACTURE SURGERY Left 2009 & 2011   COLONOSCOPY N/A 01/14/2018   Procedure: COLONOSCOPY;  Surgeon: Doran Stabler, MD;   Location: [facility];  Service: Gastroenterology;  Laterality: N/A;   ESOPHAGOGASTRODUODENOSCOPY (EGD) WITH PROPOFOL N/A 01/14/2018   Procedure: ESOPHAGOGASTRODUODENOSCOPY (EGD) WITH PROPOFOL;  Surgeon: Doran Stabler, MD;  Location: [facility];  Service: Gastroenterology;  Laterality: N/A;   SPINE SURGERY  2004   L4-6, fusion   SPINE SURGERY  1995   c2 facture, halo fixation   WISDOM TOOTH EXTRACTION       Allergies  Allergen Reactions   Penicillins Other (See Comments)    Had a seizure; occurred as an infant; along with sulfa   Sulfa Antibiotics Other (See Comments)    Had a seizure; occurred as an infant; along with pencillin      Family History  Problem Relation Age of Onset   Heart disease Mother    Hyperlipidemia Mother    Hypertension Mother    Arthritis Mother    Alcohol abuse Mother    Arthritis Father    COPD Father    Alcohol abuse Father    Alcohol abuse Sister    Alcohol abuse Brother    Alcohol abuse Sister    Colon cancer Neg Hx      Social History Nathaniel Mills reports that he has been smoking cigars. He has a 35.00 pack-year smoking history. He has never used smokeless tobacco. Nathaniel Mills reports current alcohol use of about 6.0 standard drinks per week.  Review of Systems CONSTITUTIONAL: No weight loss, fever, chills, weakness or fatigue.  HEENT: Eyes: No visual loss, blurred vision, double vision or yellow sclerae.No hearing loss, sneezing, congestion, runny nose or sore throat.  SKIN: No rash or itching.  CARDIOVASCULAR: per hpi RESPIRATORY: No shortness of breath, cough or sputum.  GASTROINTESTINAL: No anorexia, nausea, vomiting or diarrhea. No abdominal pain or blood.  GENITOURINARY: No burning on urination, no polyuria NEUROLOGICAL: No headache, dizziness, syncope, paralysis, ataxia, numbness or tingling in the extremities. No change in bowel or bladder control.  MUSCULOSKELETAL: No muscle, back pain, joint pain or stiffness.   LYMPHATICS: No enlarged nodes. No history of splenectomy.  PSYCHIATRIC: No history of depression or anxiety.  ENDOCRINOLOGIC: No reports of sweating, cold or heat intolerance. No polyuria or polydipsia.  Marland Kitchen   Physical Examination Today's Vitals   08/17/21 0854  BP: 136/84  Pulse: 72  SpO2: 97%  Weight: 215 lb 9.6 oz (97.8 kg)  Height: 5\' 11"  (1.803 m)   Body mass index is 30.07 kg/m.  Gen: resting comfortably, no acute distress HEENT: no scleral icterus, pupils equal round and reactive, no palptable cervical adenopathy,  CV: RRR, no m/r/g no jvd Resp: Clear to auscultation bilaterally GI: abdomen is soft, non-tender, non-distended, normal bowel sounds, no hepatosplenomegaly MSK: extremities are warm, no edema.  Skin: warm, no rash Neuro:  no focal deficits Psych: appropriate affect    Assessment and Plan  Chest pain - recent exertional chest pains in patient with multiple CAD risk factors - plan for exercise nuclear stress test to further evaluate - start ASA 81mg  daily for the time being.       Arnoldo Lenis, M.D.

## 2021-08-17 NOTE — Patient Instructions (Addendum)
Medication Instructions:  Your physician has recommended you make the following change in your medication:  Start aspirin 81 mg daily Continue other medications the same  Labwork: none  Testing/Procedures: Your physician has requested that you have en exercise stress myoview. For further information please visit [email or web address]. Please follow instruction sheet, as given.  Follow-Up: Your physician recommends that you schedule a follow-up appointment in: pending  Any Other Special Instructions Will Be Listed Below (If Applicable).  If you need a refill on your cardiac medications before your next appointment, please call your pharmacy.

## 2021-08-19 NOTE — Telephone Encounter (Signed)
Patient is aware you are out of the office until next week but he wants to make you aware that he reduced the Gabapentin to twice a day and feels that dosage works well for him.  It helps with what it was prescribed for but does not make him feel "loopy".  He would like to continue the medication at this frequency if it is okay with you instead of three times daily.

## 2021-08-23 NOTE — Telephone Encounter (Signed)
PT AWARE  

## 2021-08-23 NOTE — Telephone Encounter (Signed)
Absolutely.

## 2021-08-26 ENCOUNTER — Other Ambulatory Visit: Payer: Self-pay

## 2021-08-26 ENCOUNTER — Ambulatory Visit (HOSPITAL_COMMUNITY)
Admission: RE | Admit: 2021-08-26 | Discharge: 2021-08-26 | Disposition: A | Discharge status: Home / Self Care | Payer: Managed Care, Other (non HMO) | Source: Ambulatory Visit | Attending: Cardiology | Admitting: Cardiology

## 2021-08-26 DIAGNOSIS — R079 Chest pain, unspecified: Secondary | ICD-10-CM | POA: Diagnosis present

## 2021-08-26 LAB — NM MYOCAR MULTI W/SPECT W/WALL MOTION / EF
Angina Index: 0
Base ST Depression (mm): 0 mm
Duke Treadmill Score: 4
Estimated workload: 4.6
Exercise duration (min): 3 min
Exercise duration (sec): 42 s
LV dias vol: 91 mL (ref 62–150)
LV sys vol: 33 mL
Nuc Stress EF: 63 %
Peak HR: 148 {beats}/min
Percent HR: 90 %
RATE: 0.4
Rest HR: 85 {beats}/min
Rest Nuclear Isotope Dose: 11 mCi
SDS: 0
SRS: 0
SSS: 0
ST Depression (mm): 0 mm
Stress Nuclear Isotope Dose: 30 mCi
TID: 1.09

## 2021-08-26 MED ORDER — REGADENOSON 0.4 MG/5ML IV SOLN
INTRAVENOUS | Status: AC
  Filled 2021-08-26: qty 5

## 2021-08-26 MED ORDER — SODIUM CHLORIDE FLUSH 0.9 % IV SOLN
INTRAVENOUS | Status: AC
  Administered 2021-08-26: 10 mL via INTRAVENOUS
  Filled 2021-08-26: qty 10

## 2021-08-26 MED ORDER — TECHNETIUM TC 99M TETROFOSMIN IV KIT
30.0000 | PACK | Freq: Once | INTRAVENOUS | Status: AC | PRN
  Administered 2021-08-26: 30 via INTRAVENOUS

## 2021-08-26 MED ORDER — TECHNETIUM TC 99M TETROFOSMIN IV KIT
10.0000 | PACK | Freq: Once | INTRAVENOUS | Status: AC | PRN
  Administered 2021-08-26: 11 via INTRAVENOUS

## 2021-08-30 ENCOUNTER — Encounter
Payer: Managed Care, Other (non HMO) | Attending: Physical Medicine & Rehabilitation | Admitting: Physical Medicine & Rehabilitation

## 2021-08-30 ENCOUNTER — Other Ambulatory Visit: Payer: Self-pay

## 2021-08-30 ENCOUNTER — Encounter: Payer: Self-pay | Admitting: Physical Medicine & Rehabilitation

## 2021-08-30 VITALS — BP 127/81 | HR 80 | Temp 97.9°F | Ht 71.0 in | Wt 219.0 lb

## 2021-08-30 DIAGNOSIS — M48062 Spinal stenosis, lumbar region with neurogenic claudication: Secondary | ICD-10-CM | POA: Insufficient documentation

## 2021-08-30 MED ORDER — DULOXETINE HCL 30 MG PO CPEP: 30.0000 mg | ORAL_CAPSULE | Freq: Every day | ORAL | 1 refills | Status: DC

## 2021-08-30 NOTE — Progress Notes (Signed)
Subjective:    Patient ID: Nathaniel Mills, male    DOB: [birth year], 55 y.o.   MRN: [id number]  HPI CC:  Back pain , weakness and numbness in both legs The patient has had these complaints for at least a couple years.  He is concerned that feeling in his feet has worsened and he has some difficulty with balance. Bending forward relieves symptoms Has been through PT, without significant relief. Lost 20lb ~67yrs ago Has exercised in past but this did no help No bowel or bladder control issue (had this prior to first lumbar surgery) L4-5 spinal fusion ~36yrs performed in [place] The patient had reevaluation at the spine center in [facility].  He was diagnosed with neurogenic claudication and was sent for lumbar myelogram.  The myelogram was done at another institution and films are not available however according to neurosurgery, Dr. Prince Rome there was complete contrast stagnation distal to L3-4 level.  The patient was doing well L3-L4 TLIF however there was some issue with insurance denial. The patient would like to try some other type of conservative care.  He would not like to be taking any medications such as opioids because he is still working full-time as an Merchandiser, retail.  Gabapentin TID made pt "super loopy"  , 300mg  BID helps some no sides effects Pain Inventory Average Pain 7 Pain Right Now 7 My pain is constant, burning, tingling, and aching  In the last 24 hours, has pain interfered with the following? General activity 8 Relation with others 1 Enjoyment of life 9 What TIME of day is your pain at its worst? evening Sleep (in general) Fair  Pain is worse with: walking and standing Pain improves with: rest and medication Relief from Meds: 4  walk without assistance how many minutes can you walk? 15 ability to climb steps?  yes do you drive?  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  no    Family History  Problem Relation Age of Onset   Heart disease Mother    Hyperlipidemia Mother    Hypertension Mother    Arthritis Mother    Alcohol abuse Mother    Arthritis Father    COPD Father    Alcohol abuse Father    Alcohol abuse Sister    Alcohol abuse Brother    Alcohol abuse Sister    Colon cancer Neg Hx    Social History   Socioeconomic History   Marital status: Married    Spouse name: Not on file   Number of children: Not on file   Years of education: Not on file   Highest education level: Not on file  Occupational History   Not on file  Tobacco Use   Smoking status: Every Day    Packs/day: 1.00    Years: 35.00    Pack years: 35.00    Types: Cigars, Cigarettes   Smokeless tobacco: Never  Vaping Use   Vaping Use: Never used  Substance and Sexual Activity   Alcohol use: Yes    Alcohol/week: 6.0 standard drinks    Types: 6 Cans of beer per week    Comment: 4 x a week   Drug use: No   Sexual activity: Yes  Other Topics Concern   Not on file  Social History Narrative   Not on file   Social Determinants of Health  Financial Resource Strain: Not on file  Food Insecurity: Not on file  Transportation Needs: Not on file  Physical Activity: Not on file  Stress: Not on file  Social Connections: Not on file   Past Surgical History:  Procedure Laterality Date   ANKLE FRACTURE SURGERY Left 2009 & 2011   COLONOSCOPY N/A 01/14/2018   Procedure: COLONOSCOPY;  Surgeon: Doran Stabler, MD;  Location: [facility];  Service: Gastroenterology;  Laterality: N/A;   ESOPHAGOGASTRODUODENOSCOPY (EGD) WITH PROPOFOL N/A 01/14/2018   Procedure: ESOPHAGOGASTRODUODENOSCOPY (EGD) WITH PROPOFOL;  Surgeon: Doran Stabler, MD;  Location: [facility];  Service: Gastroenterology;  Laterality: N/A;   SPINE SURGERY  2004   L4-6, fusion   [place]   c2 facture, halo fixation   WISDOM TOOTH EXTRACTION      Past Medical History:  Diagnosis Date   Allergy    Blood transfusion without reported diagnosis 2004   GERD (gastroesophageal reflux disease) 2004-2012   Hyperlipidemia    BP 127/81   Pulse 80   Temp 97.9 F (36.6 C) (Oral)   Ht 5\' 11"  (1.803 m)   Wt 219 lb (99.3 kg)   SpO2 98%   BMI 30.54 kg/m   Opioid Risk Score:   Fall Risk Score:  `1  Depression screen PHQ 2/9  Depression screen [facility] 2/9 06/30/2021 12/21/2020 09/28/2020 06/28/2020 06/24/2019 06/28/2018 07/03/2017  Decreased Interest 0 0 0 0 0 0 0  Down, Depressed, Hopeless 0 0 0 0 0 0 0  PHQ - 2 Score 0 0 0 0 0 0 0  Altered sleeping - - 0 - 0 - -  Tired, decreased energy - - 0 - 0 - -  Change in appetite - - 0 - 0 - -  Feeling bad or failure about yourself  - - 0 - 0 - -  Trouble concentrating - - 0 - 0 - -  Moving slowly or fidgety/restless - - 0 - 0 - -  Suicidal thoughts - - 0 - 0 - -  PHQ-9 Score - - 0 - 0 - -     Review of Systems  Musculoskeletal:  Positive for back pain and gait problem.       Bilateral lower leg pain(front and back)  Neurological:  Positive for weakness and numbness.  All other systems reviewed and are negative.     Objective:   Physical Exam Vitals and nursing note reviewed.  Constitutional:      Appearance: He is obese.  HENT:     Head: Normocephalic and atraumatic.  Eyes:     General: No visual field deficit.    Extraocular Movements: Extraocular movements intact.     Conjunctiva/sclera: Conjunctivae normal.     Pupils: Pupils are equal, round, and reactive to light.  Cardiovascular:     Rate and Rhythm: Normal rate and regular rhythm.     Pulses: Normal pulses.     Heart sounds: Normal heart sounds.  Pulmonary:     Effort: Pulmonary effort is normal. No respiratory distress.     Breath sounds: Normal breath sounds.  Abdominal:     General: Abdomen is flat. There is no distension.     Palpations: Abdomen is soft.  Musculoskeletal:        General: Tenderness present. No  deformity.     Comments: Well-healed lumbar incision There is decreased lumbar spine range of motion of around 50% flexion extension is limited as well to about 25% of  normal lateral bending is 25% normal. Negative straight leg raising bilaterally There is no evidence of joint swelling in the knees or ankles bilaterally. No pain with hip knee or ankle range of motion.  Skin:    General: Skin is warm and dry.  Neurological:     Mental Status: He is alert and oriented to person, place, and time.     Cranial Nerves: No dysarthria or facial asymmetry.     Sensory: Sensory deficit present.     Coordination: Coordination is intact.     Gait: Gait abnormal.     Deep Tendon Reflexes:     Reflex Scores:      Patellar reflexes are 1+ on the right side and 1+ on the left side.      Achilles reflexes are 0 on the right side and 0 on the left side.    Comments: There is reduced sensation below the ankles bilaterally more pronounced on the right than on the left side. There is also diminished proprioception more so on the right compared to the left side.  On the right there is decreased proprioception at both the great toe and the ankle worse on the left side there is only mild deficit at the great toe. Motor strength is 5/5 bilateral hip flexor knee extensor ankle dorsiflexion.  Psychiatric:        Mood and Affect: Mood normal.        Behavior: Behavior normal.          Assessment & Plan:  1.  Lumbar spinal stenosis central canal stenosis at L3-L4.  The majority of his sensory complaints are in the L5-S1 distribution.  We discussed that lumbar injections in general work better for radicular pain then for sensory loss.  He does indicate he has pain as well and does take his gabapentin for this. Given location of his canal stenosis as well and has sensory symptoms would recommend caudal epidural injection under fluoroscopic guidance.  Alternatively may inject at S1 but this would be bilateral.   Discussed with patient agrees with plan

## 2021-08-30 NOTE — Patient Instructions (Signed)
Duloxetine Delayed-Release Capsules What is this medication? DULOXETINE (doo LOX e teen) treats depression, anxiety, fibromyalgia, and certain types of chronic pain such as nerve, bone, or joint pain. It increases the amount of serotonin and norepinephrine in the brain, hormones that help regulate mood and pain. It belongs to a group of medications called SNRIs. This medicine may be used for other purposes; ask your health care provider or pharmacist if you have questions. COMMON BRAND NAME(S): Cymbalta, Drizalma, Irenka What should I tell my care team before I take this medication? They need to know if you have any of these conditions: Bipolar disorder Glaucoma High blood pressure Kidney disease Liver disease Seizures Suicidal thoughts, plans or attempt; a previous suicide attempt by you or a family member Take medications that treat or prevent blood clots Taken medications called MAOIs like Carbex, Eldepryl, Marplan, Nardil, and Parnate within 14 days Trouble passing urine An unusual reaction to duloxetine, other medications, foods, dyes, or preservatives Pregnant or trying to get pregnant Breast-feeding How should I use this medication? Take this medication by mouth with a glass of water. Follow the directions on the prescription label. Do not crush, cut or chew some capsules of this medication. Some capsules may be opened and sprinkled on applesauce. Check with your care team or pharmacist if you are not sure. You can take this medication with or without food. Take your medication at regular intervals. Do not take your medication more often than directed. Do not stop taking this medication suddenly except upon the advice of your care team. Stopping this medication too quickly may cause serious side effects or your condition may worsen. A special MedGuide will be given to you by the pharmacist with each prescription and refill. Be sure to read this information carefully each time. Talk to  your care team regarding the use of this medication in children. While this medication may be prescribed for children as young as 7 years of age for selected conditions, precautions do apply. Overdosage: If you think you have taken too much of this medicine contact a poison control center or emergency room at once. NOTE: This medicine is only for you. Do not share this medicine with others. What if I miss a dose? If you miss a dose, take it as soon as you can. If it is almost time for your next dose, take only that dose. Do not take double or extra doses. What may interact with this medication? Do not take this medication with any of the following: Desvenlafaxine Levomilnacipran Linezolid MAOIs like Carbex, Eldepryl, Emsam, Marplan, Nardil, and Parnate Methylene blue (injected into a vein) Milnacipran Safinamide Thioridazine Venlafaxine Viloxazine This medication may also interact with the following: Alcohol Amphetamines Aspirin and aspirin-like medications Certain antibiotics like ciprofloxacin and enoxacin Certain medications for blood pressure, heart disease, irregular heart beat Certain medications for depression, anxiety, or psychotic disturbances Certain medications for migraine headache like almotriptan, eletriptan, frovatriptan, naratriptan, rizatriptan, sumatriptan, zolmitriptan Certain medications that treat or prevent blood clots like warfarin, enoxaparin, and dalteparin Cimetidine Fentanyl Lithium NSAIDS, medications for pain and inflammation, like ibuprofen or naproxen Phentermine Procarbazine Rasagiline Sibutramine St. John's wort Theophylline Tramadol Tryptophan This list may not describe all possible interactions. Give your health care provider a list of all the medicines, herbs, non-prescription drugs, or dietary supplements you use. Also tell them if you smoke, drink alcohol, or use illegal drugs. Some items may interact with your medicine. What should I watch  for while using this medication? Tell your   care team if your symptoms do not get better or if they get worse. Visit your care team for regular checks on your progress. Because it may take several weeks to see the full effects of this medication, it is important to continue your treatment as prescribed by your care team. This medication may cause serious skin reactions. They can happen weeks to months after starting the medication. Contact your care team right away if you notice fevers or flu-like symptoms with a rash. The rash may be red or purple and then turn into blisters or peeling of the skin. Or, you might notice a red rash with swelling of the face, lips, or lymph nodes in your neck or under your arms. Watch for new or worsening thoughts of suicide or depression. This includes sudden changes in mood, behaviors, or thoughts. These changes can happen at any time but are more common in the beginning of treatment or after a change in dose. Call your care team right away if you experience these thoughts or worsening depression. Manic episodes may happen in patients with bipolar disorder who take this medication. Watch for changes in feelings or behaviors such as feeling anxious, nervous, agitated, panicky, irritable, hostile, aggressive, impulsive, severely restless, overly excited and hyperactive, or trouble sleeping. These symptoms can happen at any time, but are more common in the beginning of treatment or after a change in dose. Call your care team right away if you notice any of these symptoms. You may get drowsy or dizzy. Do not drive, use machinery, or do anything that needs mental alertness until you know how this medication affects you. Do not stand or sit up quickly, especially if you are an older patient. This reduces the risk of dizzy or fainting spells. Alcohol may interfere with the effect of this medication. Avoid alcoholic drinks. This medication may increase blood sugar. The risk may be  higher in patients who already have diabetes. Ask your care team what you can do to lower your risk of diabetes while taking this medication. This medication can cause an increase in blood pressure. This medication can also cause a sudden drop in your blood pressure, which may make you feel faint and increase the chance of a fall. These effects are most common when you first start the medication or when the dose is increased, or during use of other medications that can cause a sudden drop in blood pressure. Check with your care team for instructions on monitoring your blood pressure while taking this medication. Your mouth may get dry. Chewing sugarless gum or sucking hard candy, and drinking plenty of water, may help. Contact your care team if the problem does not go away or is severe. What side effects may I notice from receiving this medication? Side effects that you should report to your care team as soon as possible: Allergic reactions-skin rash, itching, hives, swelling of the face, lips, tongue, or throat Bleeding-bloody or black, tar-like stools, red or dark brown urine, vomiting blood or brown material that looks like coffee grounds, small, red or purple spots on skin, unusual bleeding or bruising Increase in blood pressure Liver injury-right upper belly pain, loss of appetite, nausea, light-colored stool, dark yellow or brown urine, yellowing skin or eyes, unusual weakness or fatigue Low sodium level-muscle weakness, fatigue, dizziness, headache, confusion Redness, blistering, peeling, or loosening of the skin, including inside the mouth Serotonin syndrome-irritability, confusion, fast or irregular heartbeat, muscle stiffness, twitching muscles, sweating, high fever, seizures, chills, vomiting, diarrhea  Sudden eye pain or change in vision such as blurry vision, seeing halos around lights, vision loss Thoughts of suicide or self-harm, worsening mood, feelings of depression Trouble passing  urine Side effects that usually do not require medical attention (report to your care team if they continue or are bothersome): Change in sex drive or performance Constipation Diarrhea Dizziness Dry mouth Excessive sweating Loss of appetite Nausea Vomiting This list may not describe all possible side effects. Call your doctor for medical advice about side effects. You may report side effects to FDA at 1-800-FDA-1088. Where should I keep my medication? Keep out of the reach of children and pets. Store at room temperature between 15 and 30 degrees C (59 to 86 degrees F). Get rid of any unused medication after the expiration date. To get rid of medications that are no longer needed or have expired: Take the medication to a medication take-back program. Check with your pharmacy or law enforcement to find a location. If you cannot return the medication, check the label or package insert to see if the medication should be thrown out in the garbage or flushed down the toilet. If you are not sure, ask your care team. If it is safe to put it in the trash, take the medication out of the container. Mix the medication with cat litter, dirt, coffee grounds, or other unwanted substance. Seal the mixture in a bag or container. Put it in the trash. NOTE: This sheet is a summary. It may not cover all possible information. If you have questions about this medicine, talk to your doctor, pharmacist, or health care provider.  2022 Elsevier/Gold Standard (2020-11-08 12:32:25)

## 2021-09-13 ENCOUNTER — Encounter: Payer: Self-pay | Admitting: *Deleted

## 2021-10-13 ENCOUNTER — Telehealth: Payer: Self-pay | Admitting: Physical Medicine & Rehabilitation

## 2021-10-13 NOTE — Telephone Encounter (Signed)
Please advise what code we will need to get authorized for Caudal ESI thank you

## 2021-10-20 ENCOUNTER — Encounter
Payer: Managed Care, Other (non HMO) | Attending: Physical Medicine & Rehabilitation | Admitting: Physical Medicine & Rehabilitation

## 2021-10-20 ENCOUNTER — Other Ambulatory Visit: Payer: Self-pay

## 2021-10-20 ENCOUNTER — Encounter: Payer: Self-pay | Admitting: Physical Medicine & Rehabilitation

## 2021-10-20 VITALS — BP 124/86 | HR 97 | Temp 98.7°F | Ht 71.0 in | Wt 218.0 lb

## 2021-10-20 DIAGNOSIS — M48062 Spinal stenosis, lumbar region with neurogenic claudication: Secondary | ICD-10-CM | POA: Insufficient documentation

## 2021-10-20 NOTE — Progress Notes (Signed)
  PROCEDURE RECORD [facility]   Name: Jarrett Albor DOB:04/12/1966 MRN: [id number]  Date:10/20/2021  Physician: Alysia Penna, MD    Nurse/CMA:   Allergies:  Allergies  Allergen Reactions   Penicillins Other (See Comments)    Had a seizure; occurred as an infant; along with sulfa   Sulfa Antibiotics Other (See Comments)    Had a seizure; occurred as an infant; along with pencillin    Consent Signed: Yes.    Is patient diabetic? No.  CBG today? N/A  Pregnant: No. LMP: No LMP for male patient. (age 56-55)  Anticoagulants: no Anti-inflammatory: no Antibiotics: no  Procedure: Bilateral Sacroiliac Epidural Steroid Injection  Position: Prone Start Time: 3:34 pm  End Time: 3:42 pm  Fluoro Time: 36  RN/CMA Labrina Lines MA Lee,CMA    Time 2:53 pm 3:45 pm    BP 124/86 117/74    Pulse 97 98    Respirations 16 16    O2 Sat 95 97    S/S 6 6    Pain Level 6/10 3/10     D/C home with Wife, patient A & O X 3, D/C instructions reviewed, and sits independently.         Subjective:    Patient ID: Dequane Strahan, male    DOB: [birth year], 55 y.o.   MRN: [id number]  HPI    Review of Systems     Objective:   Physical Exam        Assessment & Plan:

## 2021-10-20 NOTE — Progress Notes (Signed)
Bilateral S1  transforaminal epidural steroid injection under fluoroscopic guidance with contrast enhancement  Indication: Lumbosacral radiculitis is not relieved by medication management or other conservative care and interfering with self-care and mobility.   Informed consent was obtained after describing risk and benefits of the procedure with the patient, this includes bleeding, bruising, infection, paralysis and medication side effects.  The patient wishes to proceed and has given written consent.  Patient was placed in prone position.  The lumbar area was marked and prepped with Betadine.  It was entered with a 25-gauge 1-1/2 inch needle and one mL of 1% lidocaine was injected into the skin and subcutaneous tissue.  Then a 22-gauge 3.5in spinal needle was inserted into the Left S1 intervertebral foramen under AP, lateral, and oblique view.  Once needle tip was within the foramen on lateral views an dnor exceeding 6 o clock position on th epedical on AP viewed Isovue 200 was inected x 64ml Then a solution containing one mL of 10 mg per mL dexamethasone and 2 mL of 1% lidocaine was injected.  The patient tolerated procedure well.  Post procedure instructions were given.  Please see post procedure form.    Pre injection 6/10 Post Injection 3/10

## 2021-10-20 NOTE — Patient Instructions (Signed)

## 2021-10-21 ENCOUNTER — Other Ambulatory Visit: Payer: Self-pay | Admitting: Physical Medicine & Rehabilitation

## 2021-12-08 ENCOUNTER — Ambulatory Visit: Payer: Managed Care, Other (non HMO) | Admitting: Physical Medicine & Rehabilitation

## 2021-12-19 ENCOUNTER — Encounter: Payer: Self-pay | Admitting: Physician Assistant

## 2021-12-23 ENCOUNTER — Encounter: Payer: Self-pay | Admitting: *Deleted

## 2021-12-28 NOTE — Progress Notes (Signed)
12/30/2021 Nathaniel Mills [id number] 15-Jul-1966   ASSESSMENT AND PLAN:   Barrett's esophagus without dysplasia Esophageal dysphagia Had dilatation 2019, due for repeat EGD with dysphagia will schedule with dilatation- symptoms very concerning, have resolved at this time discussed symptoms of food impaction and to call office/go to ER if this happens again. First available with Dr. Loletha Carrow is March 1st.  Will stop omeprazole and switch to pantoprazole 40 mg BID I discussed risks of EGD with dilatation with patient today, including risk of sedation, bleeding or perforation.  Patient provides understanding and gave verbal consent to proceed.  History of colonic polyps Colonoscopy 2019 Dr. Loletha Carrow, 8 mm sessile polyp without dysplasia cecum flat appearing multiple diverticula-recall 3 years We have discussed the risks of bleeding, infection, perforation, medication reactions, a 10-20% miss rate for small colon cancer or polyp and remote risk of death associated with colonoscopy. All questions were answered and the patient acknowledges these risk and wishes to proceed.  Tobacco use Discussed risks associated with tobacco use and advised to quit Information given to the patient  Chest pain- ? Non cardiac from esophagus Dr. Carlyle Dolly 08/17/2021 for chest pain.   Patient had low risk myocardial perfusion study 08/26/2021.  Patient Care Team: Janora Norlander, DO as PCP - General (Family Medicine) Harl Bowie Alphonse Guild, MD as PCP - Cardiology (Cardiology)  HISTORY OF PRESENT ILLNESS: 56 y.o. male referred by Janora Norlander, DO, with a past medical history of GERD, Barrett's without dysplasia 2019 due for recall last year, personal history of sessile polyp due for recall and others listed below presents for evaluation of dysphagia. Patient just recently evaluated by cardiology, Dr. Carlyle Dolly 08/17/2021 for chest pain.   Patient had low risk myocardial perfusion study  08/26/2021.  Has noticed progressive worsening of dysphagia, having to have smaller and smaller pieces.  Then 2 weeks ago had an episode with pork. States it got stuck, he regurgitated it up.  For about 24-36 hours he had cough, unable to swallow his own spit, and painful swallowing.  This resolved and he was able to start drinking and eating again.  He has to eat small bites, be careful with chewing. Can burp with regurg at times.  He is on prilosec 20 mg daily.  He denies abdominal pain, constipation, blood/mucus in emesis, melena, hematochezia.  Denies NSAID use. Denies ETOH use.   Patient is not on a blood thinner.  Patient actively trying to lose weight with keto diet. No night sweats.    Wt Readings from Last 3 Encounters:  12/30/21 212 lb (96.2 kg)  10/20/21 218 lb (98.9 kg)  08/30/21 219 lb (99.3 kg)   Prior GI Studies Colonoscopy 2019 Dr. Loletha Carrow, 8 mm sessile polyp without dysplasia cecum flat appearing multiple diverticula-recall 3 years Endoscopy 2019 Dr. Loletha Carrow due to dysphagia 9 appearing esophageal stenosis status postdilatation 18 mm, Short segment Barrett's esophagus without dysplasia, normal stomach normal duodenum-.  Recall 3 years  07/01/2021  HGB 16.3 MCV 94 without evidence of anemia WBC 12.3 Platelets 225 07/01/2021  BUN 15 Cr 0.80  GFR 88  Potassium 4.9   07/01/2021  AST 19 ALT 22 Alkphos 93 TBili 0.7 07/01/2021 TSH 1.960  Current Medications:    Current Outpatient Medications (Cardiovascular):    atorvastatin (LIPITOR) 20 MG tablet, Take 1 tablet (20 mg total) by mouth daily.   Current Outpatient Medications (Analgesics):    acetaminophen (TYLENOL) 500 MG tablet, Take 1,000 mg by mouth 2 (two)  times daily as needed.   Current Outpatient Medications (Other):    DULoxetine (CYMBALTA) 30 MG capsule, Take 1 capsule by mouth once daily   gabapentin (NEURONTIN) 300 MG capsule, Take 1 capsule (300 mg total) by mouth 3 (three) times daily. (Patient taking  differently: Take 300 mg by mouth 2 (two) times daily.)   omeprazole (PRILOSEC) 20 MG capsule, TAKE 1 CAPSULE BY MOUTH EVERY DAY   Tetrahydrozoline HCl (VISINE OP), Place 2 drops into both eyes daily as needed (for dry eyes).   traZODone (DESYREL) 50 MG tablet, Take 1-1.5 tablets (50-75 mg total) by mouth at bedtime as needed for sleep.  Medical History:  Past Medical History:  Diagnosis Date   Allergy    Barrett's esophagus    Blood transfusion without reported diagnosis 2004   Diverticulosis    Esophageal stenosis    GERD (gastroesophageal reflux disease) 2004-2012   Hyperlipidemia    Lumbar spinal stenosis    Sessile colonic polyp    Status post dilation of esophageal narrowing    Allergies:  Allergies  Allergen Reactions   Penicillins Other (See Comments)    Had a seizure; occurred as an infant; along with sulfa   Sulfa Antibiotics Other (See Comments)    Had a seizure; occurred as an infant; along with pencillin     Surgical History:  He  has a past surgical history that includes Ankle fracture surgery (Left, 2009 & 2011); Lumbar fusion (2004); Cervical spine surgery (1995); Wisdom tooth extraction; Colonoscopy (N/A, 01/14/2018); Esophagogastroduodenoscopy (egd) with propofol (N/A, 01/14/2018); and Tonsillectomy and adenoidectomy. Family History:  His family history includes Alcohol abuse in his brother, father, mother, sister, and sister; Arthritis in his father and mother; Bladder Cancer in his father; COPD in his father; Diabetes in his sister; Heart disease in his mother and sister; Hyperlipidemia in his mother; Hypertension in his mother; Lung cancer in his maternal grandfather. Social History:   reports that he has been smoking cigars and cigarettes. He has a 35.00 pack-year smoking history. He has never used smokeless tobacco. He reports that he does not currently use alcohol after a past usage of about 6.0 standard drinks per week. He reports that he does not use  drugs.  REVIEW OF SYSTEMS  : All other systems reviewed and negative except where noted in the History of Present Illness.   PHYSICAL EXAM: BP 90/70 (BP Location: Left Arm, Patient Position: Sitting, Cuff Size: Normal)    Pulse 88    Ht 5' 10.25" (1.784 m) Comment: height measured without shoes   Wt 212 lb (96.2 kg)    BMI 30.20 kg/m  General:   Pleasant, well developed male in no acute distress Head:  Normocephalic and atraumatic. Eyes: sclerae anicteric,conjunctive pink  Heart:  regular rate and rhythm Pulm: Clear anteriorly; no wheezing Abdomen:  Soft, Flat AB, skin exam normal, Normal bowel sounds.  no  tenderness Without guarding and Without rebound, without hepatomegaly. Extremities:  Without edema. Msk:  Symmetrical without gross deformities. Peripheral pulses intact.  Neurologic:  Alert and  oriented x4;  grossly normal neurologically. Skin:   Dry and intact without significant lesions or rashes. Psychiatric: Demonstrates good judgement and reason without abnormal affect or behaviors.   Vladimir Crofts, PA-C 10:59 AM

## 2021-12-30 ENCOUNTER — Ambulatory Visit (INDEPENDENT_AMBULATORY_CARE_PROVIDER_SITE_OTHER): Payer: Managed Care, Other (non HMO) | Admitting: Physician Assistant

## 2021-12-30 ENCOUNTER — Encounter: Payer: Self-pay | Admitting: Physician Assistant

## 2021-12-30 VITALS — BP 90/70 | HR 88 | Ht 70.25 in | Wt 212.0 lb

## 2021-12-30 DIAGNOSIS — K2270 Barrett's esophagus without dysplasia: Secondary | ICD-10-CM

## 2021-12-30 DIAGNOSIS — Z86010 Personal history of colonic polyps: Secondary | ICD-10-CM | POA: Diagnosis not present

## 2021-12-30 DIAGNOSIS — R1319 Other dysphagia: Secondary | ICD-10-CM

## 2021-12-30 DIAGNOSIS — F17200 Nicotine dependence, unspecified, uncomplicated: Secondary | ICD-10-CM | POA: Diagnosis not present

## 2021-12-30 MED ORDER — PLENVU 140 G PO SOLR: 1.0000 | ORAL | 0 refills | Status: DC

## 2021-12-30 NOTE — Patient Instructions (Addendum)
You have been scheduled for an endoscopy and colonoscopy. Please follow the written instructions given to you at your visit today. Please pick up your prep supplies at the pharmacy within the next 1-3 days. If you use inhalers (even only as needed), please bring them with you on the day of your procedure.   Please take your proton pump inhibitor medication 30 minutes to 1 hour before meals- this makes it more effective. INCREASE TO TWICE A DAY Avoid spicy and acidic foods Avoid fatty foods Limit your intake of coffee, tea, alcohol, and carbonated drinks Work to maintain a healthy weight Keep the head of the bed elevated at least 3 inches with blocks or a wedge pillow if you are having any nighttime symptoms Stay upright for 2 hours after eating Avoid meals and snacks three to four hours before bedtime  IF YOU HAVE ANOTHER EPISODE PLEASE CALL THE OFFICE OR GO THE ER   If you are age 78 or older, your body mass index should be between 23-30. Your Body mass index is 30.2 kg/m. If this is out of the aforementioned range listed, please consider follow up with your Primary Care Provider.  If you are age 57 or younger, your body mass index should be between 19-25. Your Body mass index is 30.2 kg/m. If this is out of the aformentioned range listed, please consider follow up with your Primary Care Provider.   ________________________________________________________  The [place] [facility] providers would like to encourage you to use [facility] to communicate with providers for non-urgent requests or questions.  Due to long hold times on the telephone, sending your provider a message by [facility] may be a faster and more efficient way to get a response.  Please allow 48 business hours for a response.  Please remember that this is for non-urgent requests.  _______________________________________________________ Due to recent changes in healthcare laws, you may see the results of your imaging and laboratory  studies on MyChart before your provider has had a chance to review them.  We understand that in some cases there may be results that are confusing or concerning to you. Not all laboratory results come back in the same time frame and the provider may be waiting for multiple results in order to interpret others.  Please give [place] 48 hours in order for your provider to thoroughly review all the results before contacting the office for clarification of your results.    SMOKING CESSATION  [employer]  [phone] for more information or for a free program for smoking cessation help.   You can call QUIT SMART 1-800-QUIT-NOW for free nicotine patches or replacement therapy- if they are out- keep calling  [facility] Can call for smoking cessation classes, [phone]  If you have a smart phone, please look up Smoke Free app, this will help you stay on track and give you information about money you have saved, life that you have gained back and a ton of more information.    ADVANTAGES OF QUITTING SMOKING Within 20 minutes, blood pressure decreases. Your pulse is at normal level. After 8 hours, carbon monoxide levels in the blood return to normal. Your oxygen level increases. After 24 hours, the chance of having a heart attack starts to decrease. Your breath, hair, and body stop smelling like smoke. After 48 hours, damaged nerve endings begin to recover. Your sense of taste and smell improve. After 72 hours, the body is virtually free of nicotine. Your bronchial tubes relax and  breathing becomes easier. After 2 to 12 weeks, lungs can hold more air. Exercise becomes easier and circulation improves. After 1 year, the risk of coronary heart disease is cut in half. After 5 years, the risk of stroke falls to the same as a nonsmoker. After 10 years, the risk of lung cancer is cut in half and the risk of other cancers decreases significantly. After 15 years, the risk of coronary  heart disease drops, usually to the level of a nonsmoker. You will have extra money to spend on things other than cigarettes.

## 2022-01-02 ENCOUNTER — Telehealth: Payer: Self-pay

## 2022-01-02 DIAGNOSIS — T884XXD Failed or difficult intubation, subsequent encounter: Secondary | ICD-10-CM

## 2022-01-02 DIAGNOSIS — R1319 Other dysphagia: Secondary | ICD-10-CM

## 2022-01-02 DIAGNOSIS — K2270 Barrett's esophagus without dysplasia: Secondary | ICD-10-CM

## 2022-01-02 NOTE — Telephone Encounter (Signed)
Routing thi message to Rosanne Sack, RN as it appears a separate communication was sent by Dr. Loletha Carrow to her.

## 2022-01-02 NOTE — Telephone Encounter (Signed)
(  For documentation purposes) There is time open at [facility] tomorrow, but the patient cannot get off work to do it. There are also no outpatient slots at [facility] for the remainder of the week.  Vaughan Basta is looking at [facility] blocks with other docs for the next two weeks to see if any openings.    Thank you for following up, Ammie.  - HD

## 2022-01-02 NOTE — Progress Notes (Signed)
01/11/22 procedure canceled at Dr. Loletha Carrow' direction. Awaiting Dr. Loletha Carrow response re: date to reschedule procedure and about the provider who will perform hosp procedure. Called pt to inform that his [facility] procedure has been canceled and to provide rationale for cancellation. LVM requesting returned call.

## 2022-01-02 NOTE — Telephone Encounter (Signed)
01/11/22 procedure canceled at Dr. Loletha Carrow' direction. Awaiting Dr. Loletha Carrow response re: date to reschedule procedure and about the provider who will perform hosp procedure. Called pt to inform that his [facility] procedure has been canceled and to provide rationale for cancellation. LVM requesting returned call.

## 2022-01-02 NOTE — Progress Notes (Signed)
____________________________________________________________  Attending physician addendum:  Thank you for sending this case to me. I have reviewed the entire note and agree with the plan.  However, this patient's previous endoscopic procedures were done in the hospital outpatient endoscopy lab due to history of difficult airway. Therefore, this procedure also needs to be done in that setting, making the timing and scheduling more challenging with that limited availability.  I will call clinic nursing staff today to see what the options are with me or another physician soon as possible.  Wilfrid Lund, MD  ____________________________________________________________

## 2022-01-02 NOTE — Telephone Encounter (Signed)
Following notes below seen from Dr. Loletha Carrow. Routing this message to Dr. Loletha Carrow to determine when and with who he would like for this pt to be scheduled. Will await his response.  Attending physician addendum:   Thank you for sending this case to me. I have reviewed the entire note and agree with the plan.   However, this patient's previous endoscopic procedures were done in the hospital outpatient endoscopy lab due to history of difficult airway. Therefore, this procedure also needs to be done in that setting, making the timing and scheduling more challenging with that limited availability.   I will call clinic nursing staff today to see what the options are with me or another physician soon as possible.   Wilfrid Lund, MD

## 2022-01-02 NOTE — Telephone Encounter (Signed)
-----   Message from Vladimir Crofts, [place] sent at 01/02/2022  9:13 AM EST ----- Need to switch to hospital due to difficult airway.

## 2022-01-03 ENCOUNTER — Other Ambulatory Visit: Payer: Self-pay

## 2022-01-03 ENCOUNTER — Other Ambulatory Visit: Payer: Self-pay | Admitting: Physical Medicine & Rehabilitation

## 2022-01-03 DIAGNOSIS — R1319 Other dysphagia: Secondary | ICD-10-CM

## 2022-01-03 DIAGNOSIS — K2270 Barrett's esophagus without dysplasia: Secondary | ICD-10-CM

## 2022-01-03 NOTE — Telephone Encounter (Signed)
No available appt slots at [facility] within the next couple of weeks with any provider. Your next available slot at [facility] is 3/27 or 3/28. Please advise, thanks

## 2022-01-03 NOTE — Telephone Encounter (Addendum)
Called and spoke with patient regarding information below. He is aware that he will not be due for a colonoscopy until 01/2023. Pt states that he had other plans this Thursday but is willing to proceed. He knows that if he can't come this week the next available appt is not until 3/28, he has been advised that this will put him at risk for a food impaction. He states that he has a driver but needs to know the time of the procedure. I told him that I will check with Dr. Loletha Carrow and see what time he discussed with the [facility] endo nurse and I will let him know. Pt verbalized understanding and had no concerns at the end of the call.  Secure chat sent to Dr. Loletha Carrow to confirm time of appt. EGD orders in epic.  01/2023 hospital colonoscopy recall in epic.

## 2022-01-03 NOTE — Telephone Encounter (Signed)
See alternate telephone encounter for details

## 2022-01-03 NOTE — Telephone Encounter (Signed)
Called and spoke with [name], nurse manager. Pt has been scheduled for EGD with dilation at [facility] on Thursday, 01/05/22 at 2:15 pm. Pt will need to arrive at [facility] by 12:45 pm with a care partner. I called pt with appt information. He has access to my chart and is aware that I will send his instructions there for his review. He denies being a diabetic or being on anti-coagulation therapy. Pt verbalized understanding of all information and had no concerns at the end of the call.  Instructions sent to patient via my chart.   Ambulatory referral to GI in epic. Secure staff message sent to [facility]

## 2022-01-03 NOTE — Telephone Encounter (Signed)
I spoke with the endoscopy nurse leader, and an upper endoscopy can be worked in at [facility] this Thursday afternoon.  There is only sufficient time for the upper endoscopy and not colonoscopy.  However, that is actually fine for him because I have reevaluated his previous colonoscopy and pathology reports, and the more recent polyp surveillance guidelines indicate a 5-year colonoscopy recall for him (rather than the initial 3-year recall advice he was given before these updated guidelines). Therefore, his colonoscopy recall should be set for March 2024.  If Nathaniel Mills is unable or unwilling to come this Thursday, then the EGD will have to be on Tuesday, March 28.  If he should choose that date, please set that in the schedule now before that slot is no longer available.  I would greatly prefer him to have the upper endoscopy this week given the severity of his dysphagia, since I think he is otherwise risking a food impaction until an endoscopic dilation can be performed.  (Send me a chat or staff message if it is to be this Thursday, and I will provide best endoscopy nurse later contact info to make arrangements)  HD

## 2022-01-03 NOTE — Addendum Note (Signed)
Addended by: Yevette Edwards on: 01/03/2022 03:12 PM   Modules accepted: Orders

## 2022-01-05 ENCOUNTER — Ambulatory Visit (HOSPITAL_BASED_OUTPATIENT_CLINIC_OR_DEPARTMENT_OTHER): Payer: Managed Care, Other (non HMO) | Admitting: Certified Registered"

## 2022-01-05 ENCOUNTER — Encounter (HOSPITAL_COMMUNITY): Payer: Self-pay | Admitting: Gastroenterology

## 2022-01-05 ENCOUNTER — Encounter (HOSPITAL_COMMUNITY)
Admission: RE | Disposition: A | Discharge status: Home / Self Care | Payer: Self-pay | Source: Home / Self Care | Attending: Gastroenterology

## 2022-01-05 ENCOUNTER — Ambulatory Visit (HOSPITAL_COMMUNITY): Payer: Managed Care, Other (non HMO) | Admitting: Certified Registered"

## 2022-01-05 ENCOUNTER — Other Ambulatory Visit: Payer: Self-pay

## 2022-01-05 ENCOUNTER — Ambulatory Visit (HOSPITAL_COMMUNITY)
Admission: RE | Admit: 2022-01-05 | Discharge: 2022-01-05 | Disposition: A | Discharge status: Home / Self Care | Payer: Managed Care, Other (non HMO) | Attending: Gastroenterology | Admitting: Gastroenterology

## 2022-01-05 DIAGNOSIS — K2270 Barrett's esophagus without dysplasia: Secondary | ICD-10-CM | POA: Diagnosis not present

## 2022-01-05 DIAGNOSIS — R1314 Dysphagia, pharyngoesophageal phase: Secondary | ICD-10-CM | POA: Diagnosis not present

## 2022-01-05 DIAGNOSIS — K222 Esophageal obstruction: Secondary | ICD-10-CM

## 2022-01-05 DIAGNOSIS — R1319 Other dysphagia: Secondary | ICD-10-CM

## 2022-01-05 DIAGNOSIS — K2289 Other specified disease of esophagus: Secondary | ICD-10-CM | POA: Insufficient documentation

## 2022-01-05 DIAGNOSIS — K219 Gastro-esophageal reflux disease without esophagitis: Secondary | ICD-10-CM | POA: Diagnosis not present

## 2022-01-05 DIAGNOSIS — F17200 Nicotine dependence, unspecified, uncomplicated: Secondary | ICD-10-CM | POA: Insufficient documentation

## 2022-01-05 DIAGNOSIS — K449 Diaphragmatic hernia without obstruction or gangrene: Secondary | ICD-10-CM | POA: Diagnosis not present

## 2022-01-05 HISTORY — PX: BIOPSY: SHX5522

## 2022-01-05 HISTORY — PX: ESOPHAGOGASTRODUODENOSCOPY (EGD) WITH PROPOFOL: SHX5813

## 2022-01-05 HISTORY — PX: BALLOON DILATION: SHX5330

## 2022-01-05 SURGERY — ESOPHAGOGASTRODUODENOSCOPY (EGD) WITH PROPOFOL
Anesthesia: Monitor Anesthesia Care

## 2022-01-05 MED ORDER — PROPOFOL 10 MG/ML IV BOLUS
INTRAVENOUS | Status: AC
  Filled 2022-01-05: qty 20

## 2022-01-05 MED ORDER — LIDOCAINE 2% (20 MG/ML) 5 ML SYRINGE
INTRAMUSCULAR | Status: DC | PRN
  Administered 2022-01-05: 100 mg via INTRAVENOUS

## 2022-01-05 MED ORDER — PROPOFOL 10 MG/ML IV BOLUS
INTRAVENOUS | Status: DC | PRN
  Administered 2022-01-05: 20 mg via INTRAVENOUS
  Administered 2022-01-05: 40 mg via INTRAVENOUS
  Administered 2022-01-05: 20 mg via INTRAVENOUS
  Administered 2022-01-05: 40 mg via INTRAVENOUS
  Administered 2022-01-05 (×3): 20 mg via INTRAVENOUS
  Administered 2022-01-05: 60 mg via INTRAVENOUS
  Administered 2022-01-05: 20 mg via INTRAVENOUS

## 2022-01-05 MED ORDER — SODIUM CHLORIDE 0.9 % IV SOLN
INTRAVENOUS | Status: DC
Start: 2022-01-05 — End: 2022-01-05

## 2022-01-05 MED ORDER — LACTATED RINGERS IV SOLN: INTRAVENOUS | Status: DC | PRN

## 2022-01-05 SURGICAL SUPPLY — 15 items

## 2022-01-05 NOTE — Anesthesia Postprocedure Evaluation (Signed)
Anesthesia Post Note  Patient: [name]  Procedure(s) Performed: ESOPHAGOGASTRODUODENOSCOPY (EGD) WITH PROPOFOL BALLOON DILATION BIOPSY     Patient location during evaluation: Endoscopy Anesthesia Type: MAC Level of consciousness: awake and alert Pain management: pain level controlled Vital Signs Assessment: post-procedure vital signs reviewed and stable Respiratory status: spontaneous breathing, nonlabored ventilation, respiratory function stable and patient connected to nasal cannula oxygen Cardiovascular status: blood pressure returned to baseline and stable Postop Assessment: no apparent nausea or vomiting Anesthetic complications: no   No notable events documented.  Last Vitals:  Vitals:   01/05/22 1430 01/05/22 1440  BP: 106/63 113/68  Pulse: 72 75  Resp: 14 (!) 24  Temp:    SpO2: 95% 94%    Last Pain:  Vitals:   01/05/22 1440  TempSrc:   PainSc: 0-No pain                 Brittanee Ghazarian L Brodey Bonn

## 2022-01-05 NOTE — H&P (Signed)
History and Physical:  This patient presents for endoscopic testing for: Esophageal dysphagia and history of esophageal stricture  Clinical details in the [facility] office note of 12/30/2021.  There are also subsequent telephone notes outlining the plan for outpatient endoscopy in the hospital endoscopy department today.  This patient has had recurrent solid food dysphagia including some brief food impactions in the last couple of months. Benign but significant EG junction stricture March 2019, balloon dilated to 18 mm.  He also had a short tongue of nondysplastic Barrett's esophagus at that time.  ROS: Patient denies chest pain or shortness of breath   Past Medical History: Past Medical History:  Diagnosis Date   Allergy    Barrett's esophagus    Blood transfusion without reported diagnosis 2004   Diverticulosis    Esophageal stenosis    GERD (gastroesophageal reflux disease) 2004-2012   Hyperlipidemia    Lumbar spinal stenosis    Sessile colonic polyp    Status post dilation of esophageal narrowing      Past Surgical History: Past Surgical History:  Procedure Laterality Date   ANKLE FRACTURE SURGERY Left 2009 & 2011   [place]   c2 facture, halo fixation   COLONOSCOPY N/A 01/14/2018   Procedure: COLONOSCOPY;  Surgeon: Doran Stabler, MD;  Location: [facility] END[facility]COPY;  Service: Gastroenterology;  Laterality: N/A;   ESOPHAGOGASTRODUODENOSCOPY (EGD) WITH PROPOFOL N/A 01/14/2018   Procedure: ESOPHAGOGASTRODUODENOSCOPY (EGD) WITH PROPOFOL;  Surgeon: Doran Stabler, MD;  Location: [facility] ENDOSCOPY;  Service: Gastroenterology;  Laterality: N/A;   LUMBAR FUSION  2004   L4-6, fusion   TONSILLECTOMY AND ADENOIDECTOMY     WISDOM TOOTH EXTRACTION      Allergies: Allergies  Allergen Reactions   Penicillins Other (See Comments)    Had a seizure; occurred as an infant; along with sulfa   Sulfa Antibiotics Other (See Comments)    Had a seizure; occurred as an  infant; along with pencillin    Outpatient Meds: Current Facility-Administered Medications  Medication Dose Route Frequency Provider Last Rate Last Admin   0.9 %  sodium chloride infusion   Intravenous Continuous Danis, Kirke Corin, MD       Facility-Administered Medications Ordered in Other Encounters  Medication Dose Route Frequency Provider Last Rate Last Admin   lactated ringers infusion   Intravenous Continuous PRN Williford, Peggy D, CRNA   New Bag at 01/05/22 1319      ___________________________________________________________________ Objective   Exam:  BP 127/73    Pulse 92    Temp 98.6 F (37 C) (Temporal)    Resp 13    Ht 5\' 11"  (1.803 m)    Wt 97.5 kg    SpO2 95%    BMI 29.99 kg/m   CV: RRR without murmur, S1/S2 Resp: clear to auscultation bilaterally, normal RR and effort noted GI: soft, no tenderness, with active bowel sounds.   Assessment: Esophageal dysphagia Esophageal stricture Nondysplastic Barrett's esophagus   Plan: EGD with biopsy and balloon dilation of stricture planned   The patient is appropriate for an endoscopic procedure in the ambulatory setting.   - Wilfrid Lund, MD

## 2022-01-05 NOTE — Interval H&P Note (Signed)
History and Physical Interval Note:  01/05/2022 1:47 PM  Nathaniel Mills  has presented today for surgery, with the diagnosis of dysphagia, hx of Barrett's esophagus.  The various methods of treatment have been discussed with the patient and family. After consideration of risks, benefits and other options for treatment, the patient has consented to  Procedure(s): ESOPHAGOGASTRODUODENOSCOPY (EGD) WITH PROPOFOL (N/A) BALLOON DILATION (N/A) as a surgical intervention.  The patient's history has been reviewed, patient examined, no change in status, stable for surgery.  I have reviewed the patient's chart and labs.  Questions were answered to the patient's satisfaction.     [name]

## 2022-01-05 NOTE — Op Note (Signed)
[facility] Patient Name: Nathaniel Mills Procedure Date: 01/05/2022 MRN: [id number] Attending MD: Estill Cotta. Loletha Carrow , MD Date of Birth: 07/01/1966 CSN: [id number] Age: 56 Admit Type: Outpatient Procedure:                Upper GI endoscopy Indications:              Esophageal dysphagia, Surveillance for malignancy                            due to personal history of Barrett's esophagus, For                            therapy of esophageal stricture Providers:                Mallie Mussel L. Loletha Carrow, MD, Carlyn Reichert, RN, Frazier Richards,                            Technician Referring MD:             Ronnie Doss, MD Medicines:                Monitored Anesthesia Care Complications:            No immediate complications. Estimated Blood Loss:     Estimated blood loss was minimal. Procedure:                Pre-Anesthesia Assessment:                           - Prior to the procedure, a History and Physical                            was performed, and patient medications and                            allergies were reviewed. The patient's tolerance of                            previous anesthesia was also reviewed. The risks                            and benefits of the procedure and the sedation                            options and risks were discussed with the patient.                            All questions were answered, and informed consent                            was obtained. Prior Anticoagulants: The patient has                            taken no previous anticoagulant or antiplatelet  agents. ASA Grade Assessment: II - A patient with                            mild systemic disease. After reviewing the risks                            and benefits, the patient was deemed in                            satisfactory condition to undergo the procedure.                           After obtaining informed consent, the endoscope was                             passed under direct vision. Throughout the                            procedure, the patient's blood pressure, pulse, and                            oxygen saturations were monitored continuously. The                            GIF-H190 ([id number]) Olympus endoscope was introduced                            through the mouth, and advanced to the second part                            of duodenum. The upper GI endoscopy was                            accomplished without difficulty. The patient                            tolerated the procedure well. Imaging was performed                            using white light and narrow band imaging to                            visualize the mucosa. The upper GI endoscopy was                            accomplished without difficulty. The patient                            tolerated the procedure well. 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      There were esophageal mucosal changes secondary to established       short-segment Barrett's  disease present in the distal esophagus. The       maximum longitudinal extent of these mucosal changes was 1-2 cm in       length. Single tongue of salmon-colored mucosa with small squamus       islands. No raised or suspicious areas seen. Mucosa was biopsied with a       cold forceps for histology in a targeted manner in the lower third of       the esophagus. One specimen bottle was sent to pathology.      One benign-appearing, half-moon shaped,intrinsic moderate stenosis was       found in the lower third of the esophagus. This stenosis measured       approximately 1.4 cm (inner diameter) x less than one cm (in length).       The stenosis was traversed. A TTS dilator was passed through the scope.       Dilation with a 15-16.5-18 mm balloon dilator was performed to 18 mm.       The dilation site was  examined and showed moderate mucosal disruption       and complete resolution of luminal narrowing.      A 1-2 cm sliding hiatal hernia was present.      The exam of the esophagus was otherwise normal.      The stomach was normal.      The cardia and gastric fundus were normal on retroflexion.      The examined duodenum was normal. Impression:               - Esophageal mucosal changes secondary to                            established short-segment Barrett's disease.                            Biopsied.                           - Benign-appearing esophageal stenosis. Dilated.                           - 1-2 cm hiatal hernia.                           - Normal stomach.                           - Normal examined duodenum. Moderate Sedation:      MAC sedation used Recommendation:           - Patient has a contact number available for                            emergencies. The signs and symptoms of potential                            delayed complications were discussed with the                            patient. Return to normal activities tomorrow.  Written discharge instructions were provided to the                            patient.                           - Resume previous diet.                           - Continue present medications.                           - Await pathology results. Procedure Code(s):        --- Professional ---                           [phone], Esophagogastroduodenoscopy, flexible,                            transoral; with transendoscopic balloon dilation of                            esophagus (less than 30 mm diameter)                           43239, 59, Esophagogastroduodenoscopy, flexible,                            transoral; with biopsy, single or multiple Diagnosis Code(s):        --- Professional ---                           K22.70, Barrett's esophagus without dysplasia                           K22.2, Esophageal  obstruction                           K44.9, Diaphragmatic hernia without obstruction or                            gangrene                           R13.14, Dysphagia, pharyngoesophageal phase CPT copyright 2019 American Medical Association. All rights reserved. The codes documented in this report are preliminary and upon coder review may  be revised to meet current compliance requirements. Jolette Lana L. Loletha Carrow, MD 01/05/2022 2:26:34 PM This report has been signed electronically. Number of Addenda: 0

## 2022-01-05 NOTE — Transfer of Care (Signed)
Immediate Anesthesia Transfer of Care Note  Patient: Nathaniel Mills  Procedure(s) Performed: ESOPHAGOGASTRODUODENOSCOPY (EGD) WITH PROPOFOL BALLOON DILATION BIOPSY  Patient Location: PACU  Anesthesia Type:MAC  Level of Consciousness: awake, alert  and oriented  Airway & Oxygen Therapy: Patient Spontanous Breathing and Patient connected to face mask oxygen  Post-op Assessment: Report given to RN and Post -op Vital signs reviewed and stable  Post vital signs: Reviewed and stable  Last Vitals:  Vitals Value Taken Time  BP 101/59 01/05/22 1420  Temp    Pulse 86 01/05/22 1421  Resp 8 01/05/22 1421  SpO2 98 % 01/05/22 1421  Vitals shown include unvalidated device data.  Last Pain:  Vitals:   01/05/22 1325  TempSrc: Temporal  PainSc: 0-No pain         Complications: No notable events documented.

## 2022-01-05 NOTE — Anesthesia Preprocedure Evaluation (Addendum)
Anesthesia Evaluation  Patient identified by MRN, date of birth, ID band Patient awake    Reviewed: Allergy & Precautions, NPO status , Patient's Chart, lab work & pertinent test results  Airway Mallampati: III  TM Distance: >3 FB Neck ROM: Full    Dental no notable dental hx. (+) Dental Advisory Given, Chipped,    Pulmonary Current Smoker and Patient abstained from smoking.,    Pulmonary exam normal breath sounds clear to auscultation       Cardiovascular negative cardio ROS Normal cardiovascular exam Rhythm:Regular Rate:Normal     Neuro/Psych negative neurological ROS  negative psych ROS   GI/Hepatic Neg liver ROS, GERD  Medicated and Controlled,  Endo/Other  negative endocrine ROS  Renal/GU negative Renal ROS  negative genitourinary   Musculoskeletal negative musculoskeletal ROS (+)   Abdominal   Peds  Hematology negative hematology ROS (+)   Anesthesia Other Findings Esophageal stricture  Reproductive/Obstetrics                            Anesthesia Physical Anesthesia Plan  ASA: 2  Anesthesia Plan: MAC   Post-op Pain Management:    Induction: Intravenous  PONV Risk Score and Plan: Propofol infusion and Treatment may vary due to age or medical condition  Airway Management Planned: Natural Airway  Additional Equipment:   Intra-op Plan:   Post-operative Plan:   Informed Consent: I have reviewed the patients History and Physical, chart, labs and discussed the procedure including the risks, benefits and alternatives for the proposed anesthesia with the patient or authorized representative who has indicated his/her understanding and acceptance.     Dental advisory given  Plan Discussed with: CRNA  Anesthesia Plan Comments:         Anesthesia Quick Evaluation

## 2022-01-05 NOTE — Discharge Instructions (Signed)
YOU HAD AN ENDOSCOPIC PROCEDURE TODAY: Refer to the procedure report and other information in the discharge instructions given to you for any specific questions about what was found during the examination. If this information does not answer your questions, please call [place] office at [phone] to clarify.  ° °YOU SHOULD EXPECT: Some feelings of bloating in the abdomen. Passage of more gas than usual. Walking can help get rid of the air that was put into your GI tract during the procedure and reduce the bloating.. ° °DIET: Your first meal following the procedure should be a light meal and then it is ok to progress to your normal diet. A half-sandwich or bowl of soup is an example of a good first meal. Heavy or fried foods are harder to digest and may make you feel nauseous or bloated. Drink plenty of fluids but you should avoid alcoholic beverages for 24 hours. If you had a esophageal dilation, please see attached instructions for diet.   ° °ACTIVITY: Your care partner should take you home directly after the procedure. You should plan to take it easy, moving slowly for the rest of the day. You can resume normal activity the day after the procedure however YOU SHOULD NOT DRIVE, use power tools, machinery or perform tasks that involve climbing or major physical exertion for 24 hours (because of the sedation medicines used during the test).  ° °SYMPTOMS TO REPORT IMMEDIATELY: °A gastroenterologist can be reached at any hour. Please call [phone]  for any of the following symptoms:  ° °Following upper endoscopy (EGD, EUS, ERCP, esophageal dilation) °Vomiting of blood or coffee ground material  °New, significant abdominal pain  °New, significant chest pain or pain under the shoulder blades  °Painful or persistently difficult swallowing  °New shortness of breath  °Black, tarry-looking or red, bloody stools ° °FOLLOW UP:  °If any biopsies were taken you will be contacted by phone or by letter within the next 1-3  weeks. Call [phone]  if you have not heard about the biopsies in 3 weeks.  °Please also call with any specific questions about appointments or follow up tests. ° °

## 2022-01-06 ENCOUNTER — Encounter (HOSPITAL_COMMUNITY): Payer: Self-pay | Admitting: Gastroenterology

## 2022-01-06 ENCOUNTER — Encounter: Payer: Self-pay | Admitting: Gastroenterology

## 2022-01-06 LAB — SURGICAL PATHOLOGY

## 2022-01-11 ENCOUNTER — Encounter: Payer: Managed Care, Other (non HMO) | Admitting: Gastroenterology

## 2022-07-05 ENCOUNTER — Other Ambulatory Visit: Payer: Self-pay | Admitting: Family Medicine

## 2022-07-05 DIAGNOSIS — Z9889 Other specified postprocedural states: Secondary | ICD-10-CM

## 2022-07-10 ENCOUNTER — Other Ambulatory Visit: Payer: Self-pay | Admitting: Family Medicine

## 2022-07-10 DIAGNOSIS — M549 Dorsalgia, unspecified: Secondary | ICD-10-CM

## 2022-07-14 ENCOUNTER — Ambulatory Visit (INDEPENDENT_AMBULATORY_CARE_PROVIDER_SITE_OTHER): Payer: Managed Care, Other (non HMO) | Admitting: Family Medicine

## 2022-07-14 ENCOUNTER — Encounter: Payer: Self-pay | Admitting: Family Medicine

## 2022-07-14 VITALS — BP 129/78 | HR 96 | Temp 97.3°F | Ht 71.0 in | Wt 223.4 lb

## 2022-07-14 DIAGNOSIS — I700 Atherosclerosis of aorta: Secondary | ICD-10-CM

## 2022-07-14 DIAGNOSIS — Z0001 Encounter for general adult medical examination with abnormal findings: Secondary | ICD-10-CM | POA: Diagnosis not present

## 2022-07-14 DIAGNOSIS — K219 Gastro-esophageal reflux disease without esophagitis: Secondary | ICD-10-CM

## 2022-07-14 DIAGNOSIS — F329 Major depressive disorder, single episode, unspecified: Secondary | ICD-10-CM

## 2022-07-14 DIAGNOSIS — E782 Mixed hyperlipidemia: Secondary | ICD-10-CM | POA: Diagnosis not present

## 2022-07-14 DIAGNOSIS — M5441 Lumbago with sciatica, right side: Secondary | ICD-10-CM

## 2022-07-14 DIAGNOSIS — N3943 Post-void dribbling: Secondary | ICD-10-CM

## 2022-07-14 DIAGNOSIS — F5101 Primary insomnia: Secondary | ICD-10-CM

## 2022-07-14 DIAGNOSIS — M549 Dorsalgia, unspecified: Secondary | ICD-10-CM

## 2022-07-14 DIAGNOSIS — Z9889 Other specified postprocedural states: Secondary | ICD-10-CM

## 2022-07-14 DIAGNOSIS — F17200 Nicotine dependence, unspecified, uncomplicated: Secondary | ICD-10-CM

## 2022-07-14 DIAGNOSIS — G8929 Other chronic pain: Secondary | ICD-10-CM

## 2022-07-14 DIAGNOSIS — Z0000 Encounter for general adult medical examination without abnormal findings: Secondary | ICD-10-CM

## 2022-07-14 DIAGNOSIS — M5442 Lumbago with sciatica, left side: Secondary | ICD-10-CM

## 2022-07-14 MED ORDER — GABAPENTIN 300 MG PO CAPS: 300.0000 mg | ORAL_CAPSULE | Freq: Every day | ORAL | 99 refills | Status: DC

## 2022-07-14 MED ORDER — OMEPRAZOLE 20 MG PO CPDR: DELAYED_RELEASE_CAPSULE | ORAL | 3 refills | Status: DC

## 2022-07-14 MED ORDER — TRAZODONE HCL 50 MG PO TABS: 50.0000 mg | ORAL_TABLET | Freq: Every evening | ORAL | 3 refills | Status: DC | PRN

## 2022-07-14 MED ORDER — DULOXETINE HCL 60 MG PO CPEP: 60.0000 mg | ORAL_CAPSULE | Freq: Every day | ORAL | 3 refills | Status: DC

## 2022-07-14 MED ORDER — ATORVASTATIN CALCIUM 20 MG PO TABS: 20.0000 mg | ORAL_TABLET | Freq: Every day | ORAL | 3 refills | Status: DC

## 2022-07-14 NOTE — Progress Notes (Signed)
Nathaniel Mills is a 56 y.o. male presents to office today for annual physical exam examination.    Concerns today include: No concerns but just notes that he continues to have back pain.  He unfortunately did not undergo his back surgery because his surgeon ended up leaving the practice prior to his surgery.  He took this as a sign did not proceed with the procedure and instead try to pursue alternative treatments with back injections.  Unfortunately the back injections were not helpful and resulted in quite a hefty medical bill.  At this time he is cottages been dealing with things.  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  He otherwise is ambulating independently.  Is not using gabapentin except for at bedtime because the 3 times daily dosing caused him to feel too disoriented.  He does not recall using the Cymbalta but would be amenable to this as he has had some reactive down feelings.  He continues to use trazodone 1 tablet at bedtime typically but occasionally 2 tablets for sleep.  This is working well and he has not had any excessive daytime symptoms.  Occupation: works for an [employer], Marital status: married Amy, Substance use: tobacco Diet: fair, Exercise: no structured Last eye exam: UTD Last dental exam: UTD Last colonoscopy: UTD 01/2028 Immunizations needed: Shingrix needed, flu shot needed Immunization History  Administered Date(s) Administered   Tdap 06/24/2019     Past Medical History:  Diagnosis Date   Allergy    Barrett's esophagus    Blood transfusion without reported diagnosis 2004   Diverticulosis    Esophageal stenosis    GERD (gastroesophageal reflux disease) 2004-2012   Hyperlipidemia    Lumbar spinal stenosis    Sessile colonic polyp    Status post dilation of esophageal narrowing    Social History   Socioeconomic History   Marital status: Married     Spouse name: Not on file   Number of children: 1   Years of education: Not on file   Highest education level: Not on file  Occupational History   Occupation: Physiological scientist  Tobacco Use   Smoking status: Every Day    Packs/day: 1.00    Years: 35.00    Total pack years: 35.00    Types: Cigars, Cigarettes   Smokeless tobacco: Never  Vaping Use   Vaping Use: Never used  Substance and Sexual Activity   Alcohol use: Not Currently    Alcohol/week: 6.0 standard drinks of alcohol    Types: 6 Cans of beer per week    Comment: 4 x a week, as of 12/30/2021 none in 6 months   Drug use: No   Sexual activity: Yes  Other Topics Concern   Not on file  Social History Narrative   Not on file   Social Determinants of Health   Financial Resource Strain: Not on file  Food Insecurity: Not on file  Transportation Needs: Not on file  Physical Activity: Not on file  Stress: Not on file  Social Connections: Not on file  Intimate Partner Violence: Not on file   Past Surgical History:  Procedure Laterality Date   ANKLE FRACTURE SURGERY Left 2009 & 2011   BALLOON DILATION N/A 01/05/2022   Procedure: Marvis Repress DILATION;  Surgeon: Sherrilyn Rist, MD;  Location: [facility];  Service: Gastroenterology;  Laterality: N/A;   BIOPSY  01/05/2022  Procedure: BIOPSY;  Surgeon: Doran Stabler, MD;  Location: [facility];  Service: Gastroenterology;;   CERVICAL SPINE SURGERY  1995   c2 facture, halo fixation   COLONOSCOPY N/A 01/14/2018   Procedure: COLONOSCOPY;  Surgeon: Doran Stabler, MD;  Location: [facility];  Service: Gastroenterology;  Laterality: N/A;   ESOPHAGOGASTRODUODENOSCOPY (EGD) WITH PROPOFOL N/A 01/14/2018   Procedure: ESOPHAGOGASTRODUODENOSCOPY (EGD) WITH PROPOFOL;  Surgeon: Doran Stabler, MD;  Location: [facility];  Service: Gastroenterology;  Laterality: N/A;   ESOPHAGOGASTRODUODENOSCOPY (EGD) WITH PROPOFOL N/A 01/05/2022   Procedure: ESOPHAGOGASTRODUODENOSCOPY  (EGD) WITH PROPOFOL;  Surgeon: Doran Stabler, MD;  Location: [facility];  Service: Gastroenterology;  Laterality: N/A;   LUMBAR FUSION  2004   L4-6, fusion   TONSILLECTOMY AND ADENOIDECTOMY     WISDOM TOOTH EXTRACTION     Family History  Problem Relation Age of Onset   Heart disease Mother    Hyperlipidemia Mother    Hypertension Mother    Arthritis Mother    Alcohol abuse Mother    Arthritis Father    COPD Father    Alcohol abuse Father    Bladder Cancer Father    Alcohol abuse Sister    Heart disease Sister    Diabetes Sister    Alcohol abuse Sister    Alcohol abuse Brother    Lung cancer Maternal Grandfather    Colon cancer Neg Hx     Current Outpatient Medications:    acetaminophen (TYLENOL) 500 MG tablet, Take 1,000 mg by mouth 2 (two) times daily as needed for mild pain or moderate pain., Disp: , Rfl:    atorvastatin (LIPITOR) 20 MG tablet, Take 1 tablet (20 mg total) by mouth daily., Disp: 90 tablet, Rfl: 3   DULoxetine (CYMBALTA) 30 MG capsule, Take 1 capsule by mouth once daily, Disp: 30 capsule, Rfl: 0   gabapentin (NEURONTIN) 300 MG capsule, Take 1 capsule (300 mg total) by mouth 3 (three) times daily. (Patient taking differently: Take 300 mg by mouth at bedtime.), Disp: 90 capsule, Rfl: PRN   omeprazole (PRILOSEC) 20 MG capsule, TAKE 1 CAPSULE BY MOUTH EVERY DAY, Disp: 90 capsule, Rfl: 3   PEG-KCl-NaCl-NaSulf-Na Asc-C (PLENVU) 140 g SOLR, Take 1 kit by mouth as directed. Use coupon: BIN: [id number] PNC: CNRX Group: [id number] ID: [id number], Disp: 1 each, Rfl: 0   Tetrahydrozoline HCl (VISINE OP), Place 2 drops into both eyes daily as needed (for dry eyes)., Disp: , Rfl:    traZODone (DESYREL) 50 MG tablet, Take 1-1.5 tablets (50-75 mg total) by mouth at bedtime as needed for sleep., Disp: 135 tablet, Rfl: 3  Allergies  Allergen Reactions   Penicillins Other (See Comments)    Had a seizure; occurred as an infant; along with sulfa   Sulfa Antibiotics Other (See  Comments)    Had a seizure; occurred as an infant; along with pencillin     ROS: Review of Systems Pertinent items noted in HPI and remainder of comprehensive ROS otherwise negative.    Physical exam BP 129/78   Pulse 96   Temp (!) 97.3 F (36.3 C)   Ht $R'5\' 11"'kE$  (1.803 m)   Wt 223 lb 6.4 oz (101.3 kg)   SpO2 96%   BMI 31.16 kg/m  General appearance: alert, cooperative, appears stated age, and no distress Head: Normocephalic, without obvious abnormality, atraumatic Eyes: negative findings: lids and lashes normal, conjunctivae and sclerae normal, corneas clear, pupils equal, round, reactive to light and accomodation, and  right sclera with scarring noted at the 9 o'clock position Ears: normal TM's and external ear canals both ears Nose: Nares normal. Septum midline. Mucosa normal. No drainage or sinus tenderness. Throat:  Moist mucous membranes with some tobacco staining of the tongue Neck: no adenopathy, no carotid bruit, supple, symmetrical, trachea midline, and thyroid not enlarged, symmetric, no tenderness/mass/nodules Back: symmetric, no curvature. ROM normal. No CVA tenderness. Lungs: clear to auscultation bilaterally Chest wall: no tenderness Heart: regular rate and rhythm, S1, S2 normal, no murmur, click, rub or gallop Abdomen: soft, non-tender; bowel sounds normal; no masses,  no organomegaly Extremities: extremities normal, atraumatic, no cyanosis or edema Pulses: 2+ and symmetric Skin: Skin color, texture, turgor normal. No rashes or lesions Lymph nodes: Cervical, supraclavicular, and axillary nodes normal. Neurologic: Alert and oriented x3.  1/4 patellar DTRs bilaterally Psych: Mood stable, speech normal, affect appropriate     07/14/2022    2:06 PM 10/20/2021    2:51 PM 08/30/2021   11:31 AM  Depression screen PHQ 2/9  Decreased Interest 1 0 2  Down, Depressed, Hopeless 1 0 1  PHQ - 2 Score 2 0 3  Altered sleeping 0  1  Tired, decreased energy 2  1  Change in  appetite 0  0  Feeling bad or failure about yourself  0  0  Trouble concentrating 0  0  Moving slowly or fidgety/restless 0  0  Suicidal thoughts 0  0  PHQ-9 Score 4  5  Difficult doing work/chores Somewhat difficult  Somewhat difficult      07/14/2022    2:06 PM 06/30/2021    9:36 AM  GAD 7 : Generalized Anxiety Score  Nervous, Anxious, on Edge 0 0  Control/stop worrying 1 0  Worry too much - different things 0 0  Trouble relaxing 0 0  Restless 0 0  Easily annoyed or irritable 0 0  Afraid - awful might happen 0 0  Total GAD 7 Score 1 0  Anxiety Difficulty Not difficult at all Not difficult at all    Assessment/ Plan: Bonnye Fava here for annual physical exam.   Annual physical exam  Abdominal aortic atherosclerosis (HCC) - Plan: CMP14+EGFR, Lipid panel, TSH  Mixed hyperlipidemia - Plan: CMP14+EGFR, Lipid panel, TSH, atorvastatin (LIPITOR) 20 MG tablet  Back pain with history of spinal surgery - Plan: gabapentin (NEURONTIN) 300 MG capsule, DULoxetine (CYMBALTA) 60 MG capsule  Primary insomnia - Plan: TSH, traZODone (DESYREL) 50 MG tablet  Tobacco use disorder - Plan: CBC  Post-void dribbling - Plan: PSA  Chronic bilateral low back pain with bilateral sciatica - Plan: gabapentin (NEURONTIN) 300 MG capsule  Gastroesophageal reflux disease without esophagitis - Plan: omeprazole (PRILOSEC) 20 MG capsule  Reactive depression - Plan: DULoxetine (CYMBALTA) 60 MG capsule  Form completed for work.  Shingles vaccine declined.  He will come in for fasting labs in the next 10 days.  Medications have been renewed  I am going to trial him on Cymbalta 60 mg daily in efforts to improve back pain and reactive depression.  I alternatively considered something like Wellbutrin if the Cymbalta is not helpful.  I have reduced his gabapentin to nightly but he can use this as an as needed med if it is causing any impairment.  Trazodone advanced to 1 to 2 tablets daily as needed to reflect  current usage  Check CBC, PSA levels with labs  GERD not discussed but PPI needed renewal  Counseled on healthy lifestyle choices, including diet (  rich in fruits, vegetables and lean meats and low in salt and simple carbohydrates) and exercise (at least 30 minutes of moderate physical activity daily).  Patient to follow up in 1 year for annual exam or sooner if needed.  Kaymen Adrian M. Lajuana Ripple, DO

## 2022-07-14 NOTE — Patient Instructions (Signed)

## 2022-07-28 ENCOUNTER — Other Ambulatory Visit: Payer: Managed Care, Other (non HMO)

## 2022-07-28 ENCOUNTER — Other Ambulatory Visit: Payer: Self-pay | Admitting: Family Medicine

## 2022-07-28 DIAGNOSIS — F17200 Nicotine dependence, unspecified, uncomplicated: Secondary | ICD-10-CM

## 2022-07-28 DIAGNOSIS — N3943 Post-void dribbling: Secondary | ICD-10-CM

## 2022-07-28 DIAGNOSIS — M549 Dorsalgia, unspecified: Secondary | ICD-10-CM

## 2022-07-28 DIAGNOSIS — E782 Mixed hyperlipidemia: Secondary | ICD-10-CM

## 2022-07-28 DIAGNOSIS — F5101 Primary insomnia: Secondary | ICD-10-CM

## 2022-07-28 DIAGNOSIS — I700 Atherosclerosis of aorta: Secondary | ICD-10-CM

## 2022-07-28 DIAGNOSIS — G8929 Other chronic pain: Secondary | ICD-10-CM

## 2022-07-29 LAB — CMP14+EGFR
ALT: 27 IU/L (ref 0–44)
AST: 23 IU/L (ref 0–40)
Albumin/Globulin Ratio: 2.4 — ABNORMAL HIGH (ref 1.2–2.2)
Albumin: 4.8 g/dL (ref 3.8–4.9)
Alkaline Phosphatase: 98 IU/L (ref 44–121)
BUN/Creatinine Ratio: 17 (ref 9–20)
BUN: 15 mg/dL (ref 6–24)
Bilirubin Total: 0.7 mg/dL (ref 0.0–1.2)
CO2: 26 mmol/L (ref 20–29)
Calcium: 9.9 mg/dL (ref 8.7–10.2)
Chloride: 101 mmol/L (ref 96–106)
Creatinine, Ser: 0.88 mg/dL (ref 0.76–1.27)
Globulin, Total: 2 g/dL (ref 1.5–4.5)
Glucose: 108 mg/dL — ABNORMAL HIGH (ref 70–99)
Potassium: 4.9 mmol/L (ref 3.5–5.2)
Sodium: 141 mmol/L (ref 134–144)
Total Protein: 6.8 g/dL (ref 6.0–8.5)
eGFR: 100 mL/min/{1.73_m2} (ref >59)

## 2022-07-29 LAB — LIPID PANEL
Chol/HDL Ratio: 3.2 ratio (ref 0.0–5.0)
Cholesterol, Total: 128 mg/dL (ref 100–199)
HDL: 40 mg/dL (ref >39)
LDL Chol Calc (NIH): 69 mg/dL (ref 0–99)
Triglycerides: 102 mg/dL (ref 0–149)
VLDL Cholesterol Cal: 19 mg/dL (ref 5–40)

## 2022-07-29 LAB — CBC
Hematocrit: 47.4 % (ref 37.5–51.0)
Hemoglobin: 16.7 g/dL (ref 13.0–17.7)
MCH: 32.4 pg (ref 26.6–33.0)
MCHC: 35.2 g/dL (ref 31.5–35.7)
MCV: 92 fL (ref 79–97)
Platelets: 248 10*3/uL (ref 150–450)
RBC: 5.15 x10E6/uL (ref 4.14–5.80)
RDW: 12.6 % (ref 11.6–15.4)
WBC: 9.8 10*3/uL (ref 3.4–10.8)

## 2022-07-29 LAB — PSA: Prostate Specific Ag, Serum: 1.1 ng/mL (ref 0.0–4.0)

## 2022-07-29 LAB — TSH: TSH: 1.82 u[IU]/mL (ref 0.450–4.500)

## 2022-08-02 ENCOUNTER — Other Ambulatory Visit: Payer: Self-pay | Admitting: Family Medicine

## 2022-08-02 DIAGNOSIS — G8929 Other chronic pain: Secondary | ICD-10-CM

## 2022-08-02 DIAGNOSIS — Z9889 Other specified postprocedural states: Secondary | ICD-10-CM

## 2022-09-15 ENCOUNTER — Other Ambulatory Visit: Payer: Self-pay | Admitting: Family Medicine

## 2022-09-15 DIAGNOSIS — Z9889 Other specified postprocedural states: Secondary | ICD-10-CM

## 2022-09-15 DIAGNOSIS — G8929 Other chronic pain: Secondary | ICD-10-CM

## 2022-11-12 ENCOUNTER — Other Ambulatory Visit: Payer: Self-pay | Admitting: Family Medicine

## 2022-11-12 DIAGNOSIS — Z9889 Other specified postprocedural states: Secondary | ICD-10-CM

## 2022-11-12 DIAGNOSIS — G8929 Other chronic pain: Secondary | ICD-10-CM

## 2022-11-15 ENCOUNTER — Other Ambulatory Visit: Payer: Self-pay | Admitting: Family Medicine

## 2022-11-15 DIAGNOSIS — Z9889 Other specified postprocedural states: Secondary | ICD-10-CM

## 2022-11-15 DIAGNOSIS — G8929 Other chronic pain: Secondary | ICD-10-CM

## 2022-12-11 ENCOUNTER — Other Ambulatory Visit: Payer: Self-pay | Admitting: Family Medicine

## 2022-12-11 DIAGNOSIS — M549 Dorsalgia, unspecified: Secondary | ICD-10-CM

## 2022-12-11 DIAGNOSIS — G8929 Other chronic pain: Secondary | ICD-10-CM

## 2022-12-12 NOTE — Telephone Encounter (Signed)
Last office visit 07/14/22 Upcoming appointment 03/02/23 Last refill 11/14/21, #90, no refills

## 2023-01-08 ENCOUNTER — Other Ambulatory Visit: Payer: Self-pay | Admitting: Family Medicine

## 2023-01-08 DIAGNOSIS — G8929 Other chronic pain: Secondary | ICD-10-CM

## 2023-01-08 DIAGNOSIS — Z9889 Other specified postprocedural states: Secondary | ICD-10-CM

## 2023-01-16 ENCOUNTER — Encounter: Payer: Self-pay | Admitting: *Deleted

## 2023-02-22 ENCOUNTER — Ambulatory Visit (INDEPENDENT_AMBULATORY_CARE_PROVIDER_SITE_OTHER): Payer: Managed Care, Other (non HMO) | Admitting: Family Medicine

## 2023-02-22 ENCOUNTER — Encounter: Payer: Self-pay | Admitting: Family Medicine

## 2023-02-22 VITALS — BP 131/85 | HR 95 | Temp 98.3°F | Ht 71.0 in | Wt 232.0 lb

## 2023-02-22 DIAGNOSIS — M549 Dorsalgia, unspecified: Secondary | ICD-10-CM | POA: Diagnosis not present

## 2023-02-22 DIAGNOSIS — G8929 Other chronic pain: Secondary | ICD-10-CM

## 2023-02-22 DIAGNOSIS — K219 Gastro-esophageal reflux disease without esophagitis: Secondary | ICD-10-CM

## 2023-02-22 DIAGNOSIS — M5442 Lumbago with sciatica, left side: Secondary | ICD-10-CM

## 2023-02-22 DIAGNOSIS — F329 Major depressive disorder, single episode, unspecified: Secondary | ICD-10-CM

## 2023-02-22 DIAGNOSIS — E782 Mixed hyperlipidemia: Secondary | ICD-10-CM

## 2023-02-22 DIAGNOSIS — M5441 Lumbago with sciatica, right side: Secondary | ICD-10-CM

## 2023-02-22 DIAGNOSIS — F5101 Primary insomnia: Secondary | ICD-10-CM

## 2023-02-22 DIAGNOSIS — Z9889 Other specified postprocedural states: Secondary | ICD-10-CM

## 2023-02-22 MED ORDER — TRAZODONE HCL 50 MG PO TABS: 50.0000 mg | ORAL_TABLET | Freq: Every evening | ORAL | 3 refills | Status: DC | PRN

## 2023-02-22 MED ORDER — DULOXETINE HCL 60 MG PO CPEP: 60.0000 mg | ORAL_CAPSULE | Freq: Every day | ORAL | 3 refills | Status: DC

## 2023-02-22 MED ORDER — OMEPRAZOLE 20 MG PO CPDR: DELAYED_RELEASE_CAPSULE | ORAL | 3 refills | Status: DC

## 2023-02-22 MED ORDER — ATORVASTATIN CALCIUM 20 MG PO TABS: 20.0000 mg | ORAL_TABLET | Freq: Every day | ORAL | 3 refills | Status: DC

## 2023-02-22 MED ORDER — GABAPENTIN 300 MG PO CAPS: 300.0000 mg | ORAL_CAPSULE | Freq: Three times a day (TID) | ORAL | 3 refills | Status: DC

## 2023-02-22 NOTE — Progress Notes (Signed)
Subjective: CC: Follow-up depression, low back pain PCP: Nathaniel Ip, DO VOP:[id number] Nathaniel Mills is a 57 y.o. male presenting to clinic today for:  1.  Reactive depression Patient reports that his mood has been under much better control with Cymbalta 60 mg.  He has not had any concerning symptoms or side effects.  He would like to continue it.  He does note that he continues to have some worry and overall sad feelings about the way things are.  He worries about the state of affairs in [place] and of course worries about his own health.  Would want to see a counselor if available  2.  Low back pain Patient with ongoing low back pain with history of spinal surgery.  He still has not seen a spinal specialist because he is reluctant to spend any more money at this point.  He is still in quite a bit of debt related to the back injections that were not effective previously.  He is compliant with Cymbalta, gabapentin.  He notes some lower extremity instability that occurs with certain movements.  Asking for a handicap placard today   ROS: Per HPI  Allergies  Allergen Reactions   Penicillins Other (See Comments)    Had a seizure; occurred as an infant; along with sulfa   Sulfa Antibiotics Other (See Comments)    Had a seizure; occurred as an infant; along with pencillin   Past Medical History:  Diagnosis Date   Allergy    Barrett's esophagus    Blood transfusion without reported diagnosis 2004   Diverticulosis    Esophageal stenosis    GERD (gastroesophageal reflux disease) 2004-2012   Hyperlipidemia    Lumbar spinal stenosis    Sessile colonic polyp    Status post dilation of esophageal narrowing     Current Outpatient Medications:    acetaminophen (TYLENOL) 500 MG tablet, Take 1,000 mg by mouth 2 (two) times daily as needed for mild pain or moderate pain., Disp: , Rfl:    atorvastatin (LIPITOR) 20 MG tablet, Take 1 tablet (20 mg total) by mouth daily., Disp: 90 tablet,  Rfl: 3   DULoxetine (CYMBALTA) 60 MG capsule, Take 1 capsule (60 mg total) by mouth daily. For mood/ musculoskeletal pain, Disp: 90 capsule, Rfl: 3   gabapentin (NEURONTIN) 300 MG capsule, TAKE 1 CAPSULE BY MOUTH THREE TIMES DAILY, Disp: 90 capsule, Rfl: 1   omeprazole (PRILOSEC) 20 MG capsule, TAKE 1 CAPSULE BY MOUTH EVERY DAY, Disp: 90 capsule, Rfl: 3   Tetrahydrozoline HCl (VISINE OP), Place 2 drops into both eyes daily as needed (for dry eyes)., Disp: , Rfl:    traZODone (DESYREL) 50 MG tablet, Take 1-2 tablets (50-100 mg total) by mouth at bedtime as needed for sleep., Disp: 180 tablet, Rfl: 3 Social History   Socioeconomic History   Marital status: Married    Spouse name: Not on file   Number of children: 1   Years of education: Not on file   Highest education level: Not on file  Occupational History   Occupation: Physiological scientist  Tobacco Use   Smoking status: Every Day    Packs/day: 1.00    Years: 35.00    Additional pack years: 0.00    Total pack years: 35.00    Types: Cigars, Cigarettes   Smokeless tobacco: Never  Vaping Use   Vaping Use: Never used  Substance and Sexual Activity   Alcohol use: Not Currently    Alcohol/week: 6.0 standard drinks of  alcohol    Types: 6 Cans of beer per week    Comment: 4 x a week, as of 12/30/2021 none in 6 months   Drug use: No   Sexual activity: Yes  Other Topics Concern   Not on file  Social History Narrative   Not on file   Social Determinants of Health   Financial Resource Strain: Not on file  Food Insecurity: Not on file  Transportation Needs: Not on file  Physical Activity: Not on file  Stress: Not on file  Social Connections: Not on file  Intimate Partner Violence: Not on file   Family History  Problem Relation Age of Onset   Heart disease Mother    Hyperlipidemia Mother    Hypertension Mother    Arthritis Mother    Alcohol abuse Mother    Arthritis Father    COPD Father    Alcohol abuse Father    Bladder  Cancer Father    Alcohol abuse Sister    Heart disease Sister    Diabetes Sister    Alcohol abuse Sister    Alcohol abuse Brother    Lung cancer Maternal Grandfather    Colon cancer Neg Hx     Objective: Office vital signs reviewed. BP 131/85   Pulse 95   Temp 98.3 F (36.8 C)   Ht 5\' 11"  (1.803 m)   Wt 232 lb (105.2 kg)   SpO2 96%   BMI 32.36 kg/m   Physical Examination:  General: Awake, alert, well nourished, No acute distress HEENT: sclera white, MMM MSK: Antalgic gait and station.  Ambulating independently.  Reduced active range of motion of lumbar spine and hips Psych: Mood is stable and speech is normal.  Patient is very pleasant, interactive.     07/14/2022    2:06 PM 10/20/2021    2:51 PM 08/30/2021   11:31 AM  Depression screen PHQ 2/9  Decreased Interest 1 0 2  Down, Depressed, Hopeless 1 0 1  PHQ - 2 Score 2 0 3  Altered sleeping 0  1  Tired, decreased energy 2  1  Change in appetite 0  0  Feeling bad or failure about yourself  0  0  Trouble concentrating 0  0  Moving slowly or fidgety/restless 0  0  Suicidal thoughts 0  0  PHQ-9 Score 4  5  Difficult doing work/chores Somewhat difficult  Somewhat difficult      07/14/2022    2:06 PM 06/30/2021    9:36 AM  GAD 7 : Generalized Anxiety Score  Nervous, Anxious, on Edge 0 0  Control/stop worrying 1 0  Worry too much - different things 0 0  Trouble relaxing 0 0  Restless 0 0  Easily annoyed or irritable 0 0  Afraid - awful might happen 0 0  Total GAD 7 Score 1 0  Anxiety Difficulty Not difficult at all Not difficult at all    Assessment/ Plan: 57 y.o. male   Back pain with history of spinal surgery - Plan: gabapentin (NEURONTIN) 300 MG capsule, DULoxetine (CYMBALTA) 60 MG capsule  Chronic bilateral low back pain with bilateral sciatica - Plan: gabapentin (NEURONTIN) 300 MG capsule  Reactive depression - Plan: DULoxetine (CYMBALTA) 60 MG capsule, Ambulatory referral to Integrated Behavioral  Health  Mixed hyperlipidemia - Plan: atorvastatin (LIPITOR) 20 MG tablet  Gastroesophageal reflux disease without esophagitis - Plan: omeprazole (PRILOSEC) 20 MG capsule  Primary insomnia - Plan: traZODone (DESYREL) 50 MG tablet  Gabapentin renewed.  Unfortunately having some progression in that back pain and now experiencing some intermittent weakness of the lower extremities but not ready to see the spinal surgeon due to multiple medical costs.  Low threshold to see spinal surgeon if anything is getting worse and he of course is well-informed and well versed in red flag signs and symptoms.  Handicap placard form completed and returned to the patient  Depression is doing much better with the Cymbalta so we will continue that at current dose.  This has been renewed for the patient.  I have placed a referral to integrated behavioral specialist for counseling services as he continues to have some worry about general state of things  Not yet due for fasting labs.  I have renewed his statin, PPI and trazodone.  He will be seen again in 6 months for annual physical with fasting labs  No orders of the defined types were placed in this encounter.  No orders of the defined types were placed in this encounter.    Nathaniel IpAshly M Din Bookwalter, DO [facility] [phone]

## 2023-03-02 ENCOUNTER — Encounter: Payer: Managed Care, Other (non HMO) | Admitting: Family Medicine

## 2023-04-02 ENCOUNTER — Ambulatory Visit (INDEPENDENT_AMBULATORY_CARE_PROVIDER_SITE_OTHER): Payer: Managed Care, Other (non HMO) | Admitting: Professional Counselor

## 2023-04-02 DIAGNOSIS — F329 Major depressive disorder, single episode, unspecified: Secondary | ICD-10-CM | POA: Diagnosis not present
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
## 2023-04-02 NOTE — Patient Instructions (Signed)
Practice mindfulness worksheet.  Look into new hobbies and activities that don't activate your pain.  Talk to PCP about that medication.

## 2023-04-03 ENCOUNTER — Institutional Professional Consult (permissible substitution): Payer: Managed Care, Other (non HMO) | Admitting: Professional Counselor

## 2023-04-16 ENCOUNTER — Ambulatory Visit (INDEPENDENT_AMBULATORY_CARE_PROVIDER_SITE_OTHER): Payer: Managed Care, Other (non HMO) | Admitting: Professional Counselor

## 2023-04-16 DIAGNOSIS — F329 Major depressive disorder, single episode, unspecified: Secondary | ICD-10-CM

## 2023-04-16 NOTE — BH Specialist Note (Signed)
MRN: [id number] NAME: Nathaniel Mills Date: 04/16/2023   Start time: 8:30 End time: 9:15 Total time: 45    Type of Service: Collaborative care   Subjective: Patient is a 57 y/o male presenting to collaborative care follow up. Patient reports improvement despite higher PHQ9 and GAD7 scores. He reports communicating with his wife more about his needs and looking for more ways to connect with her. He reports his mood is still greatly impacted by his back pain as he has trouble sleeping as a result ofd this pain. He also reports continued negative thinking when it comes to the current political climate. Patient reports he has gotten his fishing license and plans to go fishing in the near future as part of his self care plan. Patient reports his sister in law has been staying with him the past month which has caused extra stress for him. He is looking forward to connecting more with his wife when she leaves next week. Patient plans to quit smoking with his wife and improve overall health.   Objective: Patient presented to session on time, well groomed, engaged and cooperative.   Screens/Assessment Tools:   PHQ-9 & GAD-7 Assessments:    04/16/2023    8:39 AM 04/02/2023    9:25 AM 07/14/2022    2:06 PM 10/20/2021    2:51 PM 08/30/2021   11:31 AM  Depression screen PHQ 2/9  Decreased Interest 2 0 1 0 2  Down, Depressed, Hopeless 1 1 1  0 1  PHQ - 2 Score 3 1 2  0 3  Altered sleeping 0 0 0  1  Tired, decreased energy 2 0 2  1  Change in appetite 0 0 0  0  Feeling bad or failure about yourself  2 1 0  0  Trouble concentrating 0 0 0  0  Moving slowly or fidgety/restless 0 1 0  0  Suicidal thoughts 0 0 0  0  PHQ-9 Score 7 3 4  5   Difficult doing work/chores Somewhat difficult Somewhat difficult Somewhat difficult  Somewhat difficult       04/16/2023    8:41 AM 04/02/2023    9:27 AM 07/14/2022    2:06 PM 06/30/2021    9:36 AM  GAD 7 : Generalized Anxiety Score  Nervous, Anxious, on Edge 2 1 0 0   Control/stop worrying 2 1 1  0  Worry too much - different things 1 0 0 0  Trouble relaxing 1 0 0 0  Restless 1 0 0 0  Easily annoyed or irritable 2 1 0 0  Afraid - awful might happen 2 1 0 0  Total GAD 7 Score 11 4 1  0  Anxiety Difficulty Somewhat difficult  Not difficult at all Not difficult at all    Functional Assessment:   Sleep: Patient explains sleep is still impacted by his pain Appetite: "Good" Mania: Denies Paranoia: Denies Hallucinations: Denies Coping ability: Moderate  Patient taking medications as prescribed: Yes  Current medications: Current Outpatient Medications on File Prior to Visit  Medication Sig Dispense Refill   acetaminophen (TYLENOL) 500 MG tablet Take 1,000 mg by mouth 2 (two) times daily as needed for mild pain or moderate pain.     atorvastatin (LIPITOR) 20 MG tablet Take 1 tablet (20 mg total) by mouth daily. 90 tablet 3   DULoxetine (CYMBALTA) 60 MG capsule Take 1 capsule (60 mg total) by mouth daily. For mood/ musculoskeletal pain 90 capsule 3   gabapentin (NEURONTIN) 300 MG capsule Take 1  capsule (300 mg total) by mouth 3 (three) times daily. 270 capsule 3   omeprazole (PRILOSEC) 20 MG capsule TAKE 1 CAPSULE BY MOUTH EVERY DAY 90 capsule 3   Tetrahydrozoline HCl (VISINE OP) Place 2 drops into both eyes daily as needed (for dry eyes).     traZODone (DESYREL) 50 MG tablet Take 1-2 tablets (50-100 mg total) by mouth at bedtime as needed for sleep. 180 tablet 3   No current facility-administered medications on file prior to visit.      Self-harm and/or Suicidal Behaviors Risk Assessment Self-harm risk factors: Low Patient endorses recent self injurious thoughts and/or behaviors: Denies   Suicide ideations: No plan to harm self or others     Danger to Others Risk Assessment Danger to others risk factors: Denies Patient endorses recent thoughts of harming others: Yes      Substance Use Assessment Patient recently consumed alcohol: Yes   Patient recently used drugs: No  Patient is concerned about dependence or abuse of substances: Denies     Goals, Interventions and Follow-up Plan; Goals: Do more activities that doesn't activate his back pain, improve self care, and decrease pain. Interventions: Mindfulness or Relaxation Training and CBT Cognitive Behavioral Therapy Plan: Continue with collaborative care bi-weekly   Esmond Harps, [facility]

## 2023-04-16 NOTE — Patient Instructions (Addendum)
Look into Al-Anon to help with the codependency issues.   Behavior Activation:  Increase positive thinking ie gratitude list, meditation and self-care. Decrease political media exposure, negative thinking and putting others first.

## 2023-04-18 ENCOUNTER — Telehealth (INDEPENDENT_AMBULATORY_CARE_PROVIDER_SITE_OTHER): Payer: Managed Care, Other (non HMO) | Admitting: Professional Counselor

## 2023-04-18 DIAGNOSIS — F330 Major depressive disorder, recurrent, mild: Secondary | ICD-10-CM | POA: Diagnosis not present

## 2023-04-18 NOTE — BH Specialist Note (Addendum)
Behavioral Health Treatment Plan Team Note  MRN: [id number] NAME: Nathaniel Mills  DATE: 04/19/23  Start time: Start Time: 0310 End time: Stop Time: 0325 Total time: Total Time in Minutes (Visit): 15 Documentation Time: 30 min Total Collaborative Care time: 45 min  Total number of Virtual BH Treatment Team Plan encounters: 1/4  Treatment Team Attendees: Esmond Harps and Dr. Vanetta Shawl  Diagnoses:  1. MDD (major depressive disorder), recurrent episode, mild (HCC)   Goals, Interventions and Follow-up Plan Goals: Increase healthy adjustment to current life circumstances  Interventions: Mindfulness or Relaxation Training CBT Cognitive Behavioral Therapy Recommendation Continue duloxetine 60 mg daily  BH specialist to follow up every other week for CBT, behavioral activation. - on Gabapentin 300 mg TID, trazodone 50-100 mg at night  History of the present illness Presenting Problem/Current Symptoms:  Patient is a 57 yo male with a history of anxiety and depressive symptoms resulting from chronic pain. Patient reports that aging and pain are his chief complaints. He reports that he "can't do things he use to do". Patient reports it began to impact his day to day life about 7 years ago. Reports losing the ability to engage in hobbies and activities he once use to enjoy as a result of his back pain. Patient reports irritability as a result as well. Reports problems with intimacy due to his back pain. Patient explains that his insurance wont cover his back surgery and this makes him angry. He often thinks about the country and political issues and channels his anger towards them. Patient is seeking counseling to help cope with the aging process and anxiety symptoms. Denies SI/HI   Screenings PHQ-9 Assessments:     04/16/2023    8:39 AM 04/02/2023    9:25 AM 07/14/2022    2:06 PM  Depression screen PHQ 2/9  Decreased Interest 2 0 1  Down, Depressed, Hopeless 1 1 1   PHQ - 2 Score 3 1 2   Altered  sleeping 0 0 0  Tired, decreased energy 2 0 2  Change in appetite 0 0 0  Feeling bad or failure about yourself  2 1 0  Trouble concentrating 0 0 0  Moving slowly or fidgety/restless 0 1 0  Suicidal thoughts 0 0 0  PHQ-9 Score 7 3 4   Difficult doing work/chores Somewhat difficult Somewhat difficult Somewhat difficult   GAD-7 Assessments:     04/16/2023    8:41 AM 04/02/2023    9:27 AM 07/14/2022    2:06 PM 06/30/2021    9:36 AM  GAD 7 : Generalized Anxiety Score  Nervous, Anxious, on Edge 2 1 0 0  Control/stop worrying 2 1 1  0  Worry too much - different things 1 0 0 0  Trouble relaxing 1 0 0 0  Restless 1 0 0 0  Easily annoyed or irritable 2 1 0 0  Afraid - awful might happen 2 1 0 0  Total GAD 7 Score 11 4 1  0  Anxiety Difficulty Somewhat difficult  Not difficult at all Not difficult at all    Past Medical History Past Medical History:  Diagnosis Date   Allergy    Barrett's esophagus    Blood transfusion without reported diagnosis 2004   Diverticulosis    Esophageal stenosis    GERD (gastroesophageal reflux disease) 2004-2012   Hyperlipidemia    Lumbar spinal stenosis    Sessile colonic polyp    Status post dilation of esophageal narrowing     Vital signs: There were no  vitals filed for this visit.  Allergies:  Allergies as of 04/18/2023 - Review Complete 02/22/2023  Allergen Reaction Noted   Penicillins Other (See Comments) 02/03/2016   Sulfa antibiotics Other (See Comments) 02/03/2016    Medication History Current medications:  Outpatient Encounter Medications as of 04/18/2023  Medication Sig   acetaminophen (TYLENOL) 500 MG tablet Take 1,000 mg by mouth 2 (two) times daily as needed for mild pain or moderate pain.   atorvastatin (LIPITOR) 20 MG tablet Take 1 tablet (20 mg total) by mouth daily.   DULoxetine (CYMBALTA) 60 MG capsule Take 1 capsule (60 mg total) by mouth daily. For mood/ musculoskeletal pain   gabapentin (NEURONTIN) 300 MG capsule Take 1 capsule  (300 mg total) by mouth 3 (three) times daily.   omeprazole (PRILOSEC) 20 MG capsule TAKE 1 CAPSULE BY MOUTH EVERY DAY   Tetrahydrozoline HCl (VISINE OP) Place 2 drops into both eyes daily as needed (for dry eyes).   traZODone (DESYREL) 50 MG tablet Take 1-2 tablets (50-100 mg total) by mouth at bedtime as needed for sleep.   No facility-administered encounter medications on file as of 04/18/2023.     Scribe for Treatment Team: Reuel Boom

## 2023-04-30 ENCOUNTER — Ambulatory Visit: Payer: Managed Care, Other (non HMO) | Admitting: Professional Counselor

## 2023-04-30 DIAGNOSIS — F330 Major depressive disorder, recurrent, mild: Secondary | ICD-10-CM

## 2023-04-30 NOTE — BH Specialist Note (Unsigned)
[facility]  MRN: [id number] NAME: Nathaniel Mills Date: 04/30/23  Start time: Start Time: 0430 End time: Stop Time: 0500 Total time: Total Time in Minutes (Visit): 30 Call number: Visit Number: 5-Fifth Visit  Reason for visit: Patient presents to collaborative care follow up on time. Patient reports "feeling better". Patient's PHQ9 and GAD7 scores have decreased. Patient reports an increase in his mood and outlook on life. Patient reports continued back pain which leads to frustration. Patient reports "this have been helping". He reports benefit from counseling.  Patient reports having more meaningful conversations with his wife. Reports her being supportive for his depression. Patient will continue mindfulness and self-care plan.   PHQ-9 Scores:     04/30/2023    4:32 PM 04/16/2023    8:39 AM 04/02/2023    9:25 AM 07/14/2022    2:06 PM 10/20/2021    2:51 PM  Depression screen PHQ 2/9  Decreased Interest 0 2 0 1 0  Down, Depressed, Hopeless 0 1 1 1  0  PHQ - 2 Score 0 3 1 2  0  Altered sleeping 0 0 0 0   Tired, decreased energy 1 2 0 2   Change in appetite 0 0 0 0   Feeling bad or failure about yourself  1 2 1  0   Trouble concentrating 0 0 0 0   Moving slowly or fidgety/restless 0 0 1 0   Suicidal thoughts 0 0 0 0   PHQ-9 Score 2 7 3 4    Difficult doing work/chores Somewhat difficult Somewhat difficult Somewhat difficult Somewhat difficult    GAD-7 Scores:     04/30/2023    4:33 PM 04/16/2023    8:41 AM 04/02/2023    9:27 AM 07/14/2022    2:06 PM  GAD 7 : Generalized Anxiety Score  Nervous, Anxious, on Edge 1 2 1  0  Control/stop worrying 0 2 1 1   Worry too much - different things 1 1 0 0  Trouble relaxing 1 1 0 0  Restless 0 1 0 0  Easily annoyed or irritable 1 2 1  0  Afraid - awful might happen 0 2 1 0  Total GAD 7 Score 4 11 4 1   Anxiety Difficulty  Somewhat difficult  Not difficult at all    Stress Current stressors:  Back Pain Sleep:  "sleeping  good" Appetite:  "good" Coping ability:  Good Patient taking medications as prescribed:    Current medications:  Outpatient Encounter Medications as of 04/30/2023  Medication Sig   acetaminophen (TYLENOL) 500 MG tablet Take 1,000 mg by mouth 2 (two) times daily as needed for mild pain or moderate pain.   atorvastatin (LIPITOR) 20 MG tablet Take 1 tablet (20 mg total) by mouth daily.   DULoxetine (CYMBALTA) 60 MG capsule Take 1 capsule (60 mg total) by mouth daily. For mood/ musculoskeletal pain   gabapentin (NEURONTIN) 300 MG capsule Take 1 capsule (300 mg total) by mouth 3 (three) times daily.   omeprazole (PRILOSEC) 20 MG capsule TAKE 1 CAPSULE BY MOUTH EVERY DAY   Tetrahydrozoline HCl (VISINE OP) Place 2 drops into both eyes daily as needed (for dry eyes).   traZODone (DESYREL) 50 MG tablet Take 1-2 tablets (50-100 mg total) by mouth at bedtime as needed for sleep.   No facility-administered encounter medications on file as of 04/30/2023.     Self-harm Behaviors Risk Assessment Self-harm risk factors:  Low Patient endorses recent thoughts of harming self:  Denies  [place] Suicide Severity  Rating Scale: Failed to redirect to the Timeline version of the REVFS SmartLink.   Danger to Others Risk Assessment Danger to others risk factors:  Low Patient endorses recent thoughts of harming others:  Denies  Dynamic Appraisal of Situational Aggression (DASA):      No data to display           Substance Use Assessment Patient recently consumed alcohol:  4 beers   Alcohol Use Disorder Identification Test (AUDIT):     04/02/2023    9:34 AM  Alcohol Use Disorder Test (AUDIT)  1. How often do you have a drink containing alcohol? 3  2. How many drinks containing alcohol do you have on a typical day when you are drinking? 2  3. How often do you have six or more drinks on one occasion? 1  AUDIT-C Score 6  4. How often during the last year have you found that you were not able to stop  drinking once you had started? 0  5. How often during the last year have you failed to do what was normally expected from you because of drinking? 0  6. How often during the last year have you needed a first drink in the morning to get yourself going after a heavy drinking session? 0  7. How often during the last year have you had a feeling of guilt of remorse after drinking? 0  8. How often during the last year have you been unable to remember what happened the night before because you had been drinking? 0  9. Have you or someone else been injured as a result of your drinking? 0  10. Has a relative or friend or a doctor or another health worker been concerned about your drinking or suggested you cut down? 0  Alcohol Use Disorder Identification Test Final Score (AUDIT) 6   Patient recently used drugs:    Opioid Risk Assessment:    Goals, Interventions and Follow-up Plan Goals:    Interventions: Mindfulness or Relaxation Training and Behavioral Activation Follow-up Plan:  Continue with collaborative care  Summary: ***  Nathaniel Mills

## 2023-04-30 NOTE — Patient Instructions (Addendum)
Continue mindfulness and self-care.  Start physical gratitude list at least once a week.   Increase exercising. Try aquatic exercises to help with back pain.

## 2023-05-14 ENCOUNTER — Ambulatory Visit: Payer: Managed Care, Other (non HMO) | Admitting: Professional Counselor

## 2023-05-14 DIAGNOSIS — F330 Major depressive disorder, recurrent, mild: Secondary | ICD-10-CM

## 2023-05-14 NOTE — Patient Instructions (Signed)
Continue collaborative care plan.

## 2023-05-14 NOTE — BH Specialist Note (Signed)
No Name Follow-up  MRN: [id number] NAME: Nathaniel Mills Date: 05/14/23  Start time: Start Time: 1030 End time: Stop Time: 1100 Total time: Total Time in Minutes (Visit): 30 Call number: Visit Number: 6-Sixth Visit  Reason for call today:  The patient, a 57 year old male, presented for a collaborative care follow-up and reported a much improved overall mood, feeling very positive. He attributes these changes to focusing on maintaining a positive outlook, decreasing his political media exposure, and detaching from conversations about the current political climate. The patient also reported an improved relationship with his wife, noting they talk more, spend more quality time together, and share a morning routine. This improvement is evident in his demeanor. His PHQ-9 and GAD-7 scores have been decreasing with each visit, and he is sleeping better. His medications are effective, and he feels he is in a much better place than when he started collaborative care. The patient expressed a desire to meet for his next visit in four weeks, feeling he has achieved many of his initial goals and demonstrated improved day-to-day functioning, including decreased stress and anxiety, decreased depression, and increased overall activities, mindfulness practice, and a positive attitude.   PHQ-9 Scores:     05/14/2023   11:33 AM 04/30/2023    4:32 PM 04/16/2023    8:39 AM 04/02/2023    9:25 AM 07/14/2022    2:06 PM  Depression screen PHQ 2/9  Decreased Interest 0 0 2 0 1  Down, Depressed, Hopeless 0 0 1 1 1   PHQ - 2 Score 0 0 3 1 2   Altered sleeping 0 0 0 0 0  Tired, decreased energy 1 1 2  0 2  Change in appetite 0 0 0 0 0  Feeling bad or failure about yourself  0 1 2 1  0  Trouble concentrating 1 0 0 0 0  Moving slowly or fidgety/restless 0 0 0 1 0  Suicidal thoughts 0 0 0 0 0  PHQ-9 Score 2 2 7 3 4   Difficult doing work/chores Not difficult at all Somewhat difficult Somewhat difficult Somewhat difficult  Somewhat difficult   GAD-7 Scores:     05/14/2023   11:34 AM 04/30/2023    4:33 PM 04/16/2023    8:41 AM 04/02/2023    9:27 AM  GAD 7 : Generalized Anxiety Score  Nervous, Anxious, on Edge 1 1 2 1   Control/stop worrying 0 0 2 1  Worry too much - different things 0 1 1 0  Trouble relaxing 0 1 1 0  Restless 1 0 1 0  Easily annoyed or irritable 0 1 2 1   Afraid - awful might happen 0 0 2 1  Total GAD 7 Score 2 4 11 4   Anxiety Difficulty Not difficult at all  Somewhat difficult     Stress Current stressors:  None Sleep:  Good Appetite:  Good Coping ability:  Good Patient taking medications as prescribed:  Yes  Current medications:  Outpatient Encounter Medications as of 05/14/2023  Medication Sig   acetaminophen (TYLENOL) 500 MG tablet Take 1,000 mg by mouth 2 (two) times daily as needed for mild pain or moderate pain.   atorvastatin (LIPITOR) 20 MG tablet Take 1 tablet (20 mg total) by mouth daily.   DULoxetine (CYMBALTA) 60 MG capsule Take 1 capsule (60 mg total) by mouth daily. For mood/ musculoskeletal pain   gabapentin (NEURONTIN) 300 MG capsule Take 1 capsule (300 mg total) by mouth 3 (three) times daily.   omeprazole (PRILOSEC) 20 MG capsule  TAKE 1 CAPSULE BY MOUTH EVERY DAY   Tetrahydrozoline HCl (VISINE OP) Place 2 drops into both eyes daily as needed (for dry eyes).   traZODone (DESYREL) 50 MG tablet Take 1-2 tablets (50-100 mg total) by mouth at bedtime as needed for sleep.   No facility-administered encounter medications on file as of 05/14/2023.    Goals, Interventions and Follow-up Plan Goals: Increase healthy adjustment to current life circumstances Interventions: Motivational Interviewing and CBT Cognitive Behavioral Therapy Follow-up Plan:  Follow up in 4 weeks   Reuel Boom

## 2023-06-11 ENCOUNTER — Ambulatory Visit: Payer: Managed Care, Other (non HMO) | Admitting: Professional Counselor

## 2023-07-02 ENCOUNTER — Other Ambulatory Visit: Payer: Self-pay

## 2023-07-02 ENCOUNTER — Telehealth: Payer: Self-pay | Admitting: Family Medicine

## 2023-07-02 DIAGNOSIS — E782 Mixed hyperlipidemia: Secondary | ICD-10-CM

## 2023-07-02 DIAGNOSIS — F17200 Nicotine dependence, unspecified, uncomplicated: Secondary | ICD-10-CM

## 2023-07-02 NOTE — Telephone Encounter (Signed)
Orders has been placed.

## 2023-08-24 ENCOUNTER — Other Ambulatory Visit: Payer: Managed Care, Other (non HMO)

## 2023-08-24 DIAGNOSIS — F17200 Nicotine dependence, unspecified, uncomplicated: Secondary | ICD-10-CM

## 2023-08-24 DIAGNOSIS — E782 Mixed hyperlipidemia: Secondary | ICD-10-CM

## 2023-08-25 LAB — CMP14+EGFR
ALT: 37 [IU]/L (ref 0–44)
AST: 28 [IU]/L (ref 0–40)
Albumin: 4.6 g/dL (ref 3.8–4.9)
Alkaline Phosphatase: 91 [IU]/L (ref 44–121)
BUN/Creatinine Ratio: 13 (ref 9–20)
BUN: 12 mg/dL (ref 6–24)
Bilirubin Total: 0.7 mg/dL (ref 0.0–1.2)
CO2: 25 mmol/L (ref 20–29)
Calcium: 10 mg/dL (ref 8.7–10.2)
Chloride: 101 mmol/L (ref 96–106)
Creatinine, Ser: 0.92 mg/dL (ref 0.76–1.27)
Globulin, Total: 2.5 g/dL (ref 1.5–4.5)
Glucose: 103 mg/dL — ABNORMAL HIGH (ref 70–99)
Potassium: 4.5 mmol/L (ref 3.5–5.2)
Sodium: 140 mmol/L (ref 134–144)
Total Protein: 7.1 g/dL (ref 6.0–8.5)
eGFR: 96 mL/min/{1.73_m2} (ref >59)

## 2023-08-25 LAB — CBC WITH DIFFERENTIAL/PLATELET
Basophils Absolute: 0.1 10*3/uL (ref 0.0–0.2)
Basos: 1 %
EOS (ABSOLUTE): 0.2 10*3/uL (ref 0.0–0.4)
Eos: 2 %
Hematocrit: 50.2 % (ref 37.5–51.0)
Hemoglobin: 17 g/dL (ref 13.0–17.7)
Immature Grans (Abs): 0 10*3/uL (ref 0.0–0.1)
Immature Granulocytes: 0 %
Lymphocytes Absolute: 2.2 10*3/uL (ref 0.7–3.1)
Lymphs: 27 %
MCH: 33 pg (ref 26.6–33.0)
MCHC: 33.9 g/dL (ref 31.5–35.7)
MCV: 98 fL — ABNORMAL HIGH (ref 79–97)
Monocytes Absolute: 0.5 10*3/uL (ref 0.1–0.9)
Monocytes: 6 %
Neutrophils Absolute: 5.3 10*3/uL (ref 1.4–7.0)
Neutrophils: 64 %
Platelets: 255 10*3/uL (ref 150–450)
RBC: 5.15 x10E6/uL (ref 4.14–5.80)
RDW: 12.7 % (ref 11.6–15.4)
WBC: 8.4 10*3/uL (ref 3.4–10.8)

## 2023-08-25 LAB — LIPID PANEL
Chol/HDL Ratio: 5.6 {ratio} — ABNORMAL HIGH (ref 0.0–5.0)
Cholesterol, Total: 209 mg/dL — ABNORMAL HIGH (ref 100–199)
HDL: 37 mg/dL — ABNORMAL LOW (ref >39)
LDL Chol Calc (NIH): 144 mg/dL — ABNORMAL HIGH (ref 0–99)
Triglycerides: 156 mg/dL — ABNORMAL HIGH (ref 0–149)
VLDL Cholesterol Cal: 28 mg/dL (ref 5–40)

## 2023-08-25 LAB — TSH: TSH: 1.87 u[IU]/mL (ref 0.450–4.500)

## 2023-08-27 ENCOUNTER — Encounter: Payer: Self-pay | Admitting: Family Medicine

## 2023-08-27 ENCOUNTER — Encounter: Payer: Managed Care, Other (non HMO) | Admitting: Family Medicine

## 2023-08-27 NOTE — Progress Notes (Deleted)
Nathaniel Mills is a 57 y.o. male presents to office today for annual physical exam examination.    Concerns today include: 1. ***  Occupation: ***, Marital status: ***, Substance use: *** Health Maintenance Due  Topic Date Due   Lung Cancer Screening  Never done   Zoster Vaccines- Shingrix (1 of 2) Never done   INFLUENZA VACCINE  Never done   COVID-19 Vaccine (1 - 2023-24 season) Never done   Refills needed today: ***  Immunization History  Administered Date(s) Administered   Tdap 06/24/2019   Past Medical History:  Diagnosis Date   Allergy    Barrett's esophagus    Blood transfusion without reported diagnosis 2004   Diverticulosis    Esophageal stenosis    GERD (gastroesophageal reflux disease) 2004-2012   Hyperlipidemia    Lumbar spinal stenosis    Sessile colonic polyp    Status post dilation of esophageal narrowing    Social History   Socioeconomic History   Marital status: Married    Spouse name: Not on file   Number of children: 1   Years of education: Not on file   Highest education level: Not on file  Occupational History   Occupation: Physiological scientist  Tobacco Use   Smoking status: Every Day    Current packs/day: 1.00    Average packs/day: 1 pack/day for 35.0 years (35.0 ttl pk-yrs)    Types: Cigars, Cigarettes   Smokeless tobacco: Never  Vaping Use   Vaping status: Never Used  Substance and Sexual Activity   Alcohol use: Not Currently    Alcohol/week: 6.0 standard drinks of alcohol    Types: 6 Cans of beer per week    Comment: 4 x a week, as of 12/30/2021 none in 6 months   Drug use: No   Sexual activity: Yes  Other Topics Concern   Not on file  Social History Narrative   Not on file   Social Determinants of Health   Financial Resource Strain: Not on file  Food Insecurity: Not on file  Transportation Needs: Not on file  Physical Activity: Not on file  Stress: Not on file  Social Connections: Unknown (03/27/2022)   Received from  [facility], [facility]   Social Network    Social Network: Not on file  Intimate Partner Violence: Unknown (02/16/2022)   Received [facility], [facility]   HITS    Physically Hurt: Not on file    Insult or Talk Down To: Not on file    Threaten Physical Harm: Not on file    Scream or Curse: Not on file   Past Surgical History:  Procedure Laterality Date   ANKLE FRACTURE SURGERY Left 2009 & 2011   BALLOON DILATION N/A 01/05/2022   Procedure: BALLOON DILATION;  Surgeon: Sherrilyn Rist, MD;  Location: [facility];  Service: Gastroenterology;  Laterality: N/A;   BIOPSY  01/05/2022   Procedure: BIOPSY;  Surgeon: Sherrilyn Rist, MD;  Location: [facility];  Service: Gastroenterology;;   CERVICAL SPINE SURGERY  1995   c2 facture, halo fixation   COLONOSCOPY N/A 01/14/2018   Procedure: COLONOSCOPY;  Surgeon: Sherrilyn Rist, MD;  Location: [facility];  Service: Gastroenterology;  Laterality: N/A;   ESOPHAGOGASTRODUODENOSCOPY (EGD) WITH PROPOFOL N/A 01/14/2018   Procedure: ESOPHAGOGASTRODUODENOSCOPY (EGD) WITH PROPOFOL;  Surgeon: Sherrilyn Rist, MD;  Location: [facility];  Service: Gastroenterology;  Laterality: N/A;   ESOPHAGOGASTRODUODENOSCOPY (EGD) WITH PROPOFOL N/A 01/05/2022   Procedure: ESOPHAGOGASTRODUODENOSCOPY (  EGD) WITH PROPOFOL;  Surgeon: Sherrilyn Rist, MD;  Location: [facility];  Service: Gastroenterology;  Laterality: N/A;   LUMBAR FUSION  2004   L4-6, fusion   TONSILLECTOMY AND ADENOIDECTOMY     WISDOM TOOTH EXTRACTION     Family History  Problem Relation Age of Onset   Heart disease Mother    Hyperlipidemia Mother    Hypertension Mother    Arthritis Mother    Alcohol abuse Mother    Arthritis Father    COPD Father    Alcohol abuse Father    Bladder Cancer Father    Alcohol abuse Sister    Heart disease Sister    Diabetes Sister    Alcohol abuse Sister    Alcohol abuse Brother    Lung cancer Maternal Grandfather    Colon  cancer Neg Hx     Current Outpatient Medications:    acetaminophen (TYLENOL) 500 MG tablet, Take 1,000 mg by mouth 2 (two) times daily as needed for mild pain or moderate pain., Disp: , Rfl:    atorvastatin (LIPITOR) 20 MG tablet, Take 1 tablet (20 mg total) by mouth daily., Disp: 90 tablet, Rfl: 3   DULoxetine (CYMBALTA) 60 MG capsule, Take 1 capsule (60 mg total) by mouth daily. For mood/ musculoskeletal pain, Disp: 90 capsule, Rfl: 3   gabapentin (NEURONTIN) 300 MG capsule, Take 1 capsule (300 mg total) by mouth 3 (three) times daily., Disp: 270 capsule, Rfl: 3   omeprazole (PRILOSEC) 20 MG capsule, TAKE 1 CAPSULE BY MOUTH EVERY DAY, Disp: 90 capsule, Rfl: 3   Tetrahydrozoline HCl (VISINE OP), Place 2 drops into both eyes daily as needed (for dry eyes)., Disp: , Rfl:    traZODone (DESYREL) 50 MG tablet, Take 1-2 tablets (50-100 mg total) by mouth at bedtime as needed for sleep., Disp: 180 tablet, Rfl: 3  Allergies  Allergen Reactions   Penicillins Other (See Comments)    Had a seizure; occurred as an infant; along with sulfa   Sulfa Antibiotics Other (See Comments)    Had a seizure; occurred as an infant; along with pencillin     ROS: Review of Systems {ros; complete:30496}    Physical exam {Exam, Complete:[id number]}      05/14/2023   11:33 AM 04/30/2023    4:32 PM 04/16/2023    8:39 AM  Depression screen PHQ 2/9  Decreased Interest 0 0 2  Down, Depressed, Hopeless 0 0 1  PHQ - 2 Score 0 0 3  Altered sleeping 0 0 0  Tired, decreased energy 1 1 2   Change in appetite 0 0 0  Feeling bad or failure about yourself  0 1 2  Trouble concentrating 1 0 0  Moving slowly or fidgety/restless 0 0 0  Suicidal thoughts 0 0 0  PHQ-9 Score 2 2 7   Difficult doing work/chores Not difficult at all Somewhat difficult Somewhat difficult      05/14/2023   11:34 AM 04/30/2023    4:33 PM 04/16/2023    8:41 AM 04/02/2023    9:27 AM  GAD 7 : Generalized Anxiety Score  Nervous, Anxious, on Edge 1 1  2 1   Control/stop worrying 0 0 2 1  Worry too much - different things 0 1 1 0  Trouble relaxing 0 1 1 0  Restless 1 0 1 0  Easily annoyed or irritable 0 1 2 1   Afraid - awful might happen 0 0 2 1  Total GAD 7 Score 2 4 11  4  Anxiety Difficulty Not difficult at all  Somewhat difficult      Assessment/ Plan: Catalina Lunger here for annual physical exam.   Annual physical exam  Screening for lung cancer  Tobacco use disorder  Mixed hyperlipidemia  Abdominal aortic atherosclerosis (HCC)  Back pain with history of spinal surgery  Reactive depression  Primary insomnia  Gastroesophageal reflux disease without esophagitis  ***  Counseled on healthy lifestyle choices, including diet (rich in fruits, vegetables and lean meats and low in salt and simple carbohydrates) and exercise (at least 30 minutes of moderate physical activity daily).  Patient to follow up ***  Nahima Ales M. Nadine Counts, DO

## 2024-02-15 ENCOUNTER — Ambulatory Visit (INDEPENDENT_AMBULATORY_CARE_PROVIDER_SITE_OTHER): Payer: Managed Care, Other (non HMO) | Admitting: Family Medicine

## 2024-02-15 ENCOUNTER — Encounter: Payer: Self-pay | Admitting: Family Medicine

## 2024-02-15 VITALS — BP 128/65 | HR 97 | Temp 97.9°F | Ht 71.0 in | Wt 245.0 lb

## 2024-02-15 DIAGNOSIS — Z0001 Encounter for general adult medical examination with abnormal findings: Secondary | ICD-10-CM

## 2024-02-15 DIAGNOSIS — R718 Other abnormality of red blood cells: Secondary | ICD-10-CM

## 2024-02-15 DIAGNOSIS — I700 Atherosclerosis of aorta: Secondary | ICD-10-CM

## 2024-02-15 DIAGNOSIS — E782 Mixed hyperlipidemia: Secondary | ICD-10-CM

## 2024-02-15 DIAGNOSIS — K219 Gastro-esophageal reflux disease without esophagitis: Secondary | ICD-10-CM

## 2024-02-15 DIAGNOSIS — Z2821 Immunization not carried out because of patient refusal: Secondary | ICD-10-CM | POA: Diagnosis not present

## 2024-02-15 DIAGNOSIS — E66811 Obesity, class 1: Secondary | ICD-10-CM

## 2024-02-15 DIAGNOSIS — Z8052 Family history of malignant neoplasm of bladder: Secondary | ICD-10-CM

## 2024-02-15 DIAGNOSIS — F329 Major depressive disorder, single episode, unspecified: Secondary | ICD-10-CM

## 2024-02-15 DIAGNOSIS — M549 Dorsalgia, unspecified: Secondary | ICD-10-CM | POA: Diagnosis not present

## 2024-02-15 DIAGNOSIS — Z125 Encounter for screening for malignant neoplasm of prostate: Secondary | ICD-10-CM

## 2024-02-15 DIAGNOSIS — Z0000 Encounter for general adult medical examination without abnormal findings: Secondary | ICD-10-CM

## 2024-02-15 DIAGNOSIS — F17200 Nicotine dependence, unspecified, uncomplicated: Secondary | ICD-10-CM

## 2024-02-15 DIAGNOSIS — F5101 Primary insomnia: Secondary | ICD-10-CM

## 2024-02-15 DIAGNOSIS — Z9889 Other specified postprocedural states: Secondary | ICD-10-CM

## 2024-02-15 DIAGNOSIS — R7309 Other abnormal glucose: Secondary | ICD-10-CM

## 2024-02-15 LAB — URINALYSIS, ROUTINE W REFLEX MICROSCOPIC
Bilirubin, UA: NEGATIVE
Glucose, UA: NEGATIVE
Ketones, UA: NEGATIVE
Leukocytes,UA: NEGATIVE
Nitrite, UA: NEGATIVE
Protein,UA: NEGATIVE
RBC, UA: NEGATIVE
Specific Gravity, UA: <1.005 — ABNORMAL LOW (ref 1.005–1.030)
Urobilinogen, Ur: 0.2 mg/dL (ref 0.2–1.0)
pH, UA: 6 (ref 5.0–7.5)

## 2024-02-15 LAB — BAYER DCA HB A1C WAIVED: HB A1C (BAYER DCA - WAIVED): 4.7 % — ABNORMAL LOW (ref 4.8–5.6)

## 2024-02-15 MED ORDER — DICLOFENAC SODIUM 75 MG PO TBEC
75.0000 mg | DELAYED_RELEASE_TABLET | Freq: Two times a day (BID) | ORAL | 1 refills | Status: DC | PRN
Start: 2024-02-15 — End: 2024-09-08

## 2024-02-15 MED ORDER — GABAPENTIN 300 MG PO CAPS: 300.0000 mg | ORAL_CAPSULE | Freq: Three times a day (TID) | ORAL | 3 refills | Status: DC

## 2024-02-15 MED ORDER — ATORVASTATIN CALCIUM 20 MG PO TABS: 20.0000 mg | ORAL_TABLET | Freq: Every day | ORAL | 3 refills | Status: AC

## 2024-02-15 MED ORDER — DULOXETINE HCL 60 MG PO CPEP: 60.0000 mg | ORAL_CAPSULE | Freq: Every day | ORAL | 3 refills | Status: AC

## 2024-02-15 MED ORDER — OMEPRAZOLE 20 MG PO CPDR: DELAYED_RELEASE_CAPSULE | ORAL | 3 refills | Status: DC

## 2024-02-15 MED ORDER — METHOCARBAMOL 500 MG PO TABS: 500.0000 mg | ORAL_TABLET | Freq: Three times a day (TID) | ORAL | 0 refills | Status: DC | PRN

## 2024-02-15 MED ORDER — TRAZODONE HCL 50 MG PO TABS: 50.0000 mg | ORAL_TABLET | Freq: Every evening | ORAL | 3 refills | Status: AC | PRN

## 2024-02-15 NOTE — Progress Notes (Signed)
 Masao Junker is a 58 y.o. male presents to office today for annual physical exam examination.    Concerns today include: 1.  Low back pain Patient reports history of low back pain in setting of back surgery.  He is going to be doing some yard work soon and anticipates he will have some spasm once and if there is anything that he might give to take and lieu of OTC management and in addition to the current Cymbalta with gabapentin that he is using.  He does report son physical deconditioning but states that this is due to him being sedentary all winter.  He typically is in better physical shape at the end of the summer.  Sadly since our last visit he has resumed smoking after having quit for 3 months.  He denies any hemoptysis, dyspnea on exertion, chest pain, shortness of breath in general, unplanned weight loss, night sweats, hematuria, peripheral discoloration or pain with ambulation.  No reports of difficulty swallowing or change in voice.  He has been smoking at least 1/2 to 1 pack/day for over 40 years now.  He is interested in lung cancer screening.  Family history significant for bladder cancer in his father, whom also smoked.  Marital status: married, Substance use: tobacco  Health Maintenance Due  Topic Date Due   Pneumococcal Vaccine 58-58 Years old (1 of 2 - PCV) Never done   Lung Cancer Screening  Never done   Zoster Vaccines- Shingrix (1 of 2) Never done   COVID-19 Vaccine (1 - 2024-25 season) Never done   Refills needed today: all  Immunization History  Administered Date(s) Administered   Tdap 06/24/2019   Past Medical History:  Diagnosis Date   Allergy    Barrett's esophagus    Blood transfusion without reported diagnosis 2004   Diverticulosis    Esophageal stenosis    GERD (gastroesophageal reflux disease) 2004-2012   Hyperlipidemia    Lumbar spinal stenosis    Sessile colonic polyp    Status post dilation of esophageal narrowing    Social History    Socioeconomic History   Marital status: Married    Spouse name: Not on file   Number of children: 1   Years of education: Not on file   Highest education level: Associate degree: occupational, Scientist, product/process development, or vocational program  Occupational History   Occupation: Physiological scientist  Tobacco Use   Smoking status: Every Day    Current packs/day: 1.00    Average packs/day: 1 pack/day for 35.0 years (35.0 ttl pk-yrs)    Types: Cigars, Cigarettes   Smokeless tobacco: Never  Vaping Use   Vaping status: Never Used  Substance and Sexual Activity   Alcohol use: Not Currently    Alcohol/week: 6.0 standard drinks of alcohol    Types: 6 Cans of beer per week    Comment: 4 x a week, as of 12/30/2021 none in 6 months   Drug use: No   Sexual activity: Yes  Other Topics Concern   Not on file  Social History Narrative   Not on file   Social Drivers of Health   Financial Resource Strain: Low Risk  (02/10/2024)   Overall Financial Resource Strain (CARDIA)    Difficulty of Paying Living Expenses: Not hard at all  Food Insecurity: No Food Insecurity (02/10/2024)   Hunger Vital Sign    Worried About Running Out of Food in the Last Year: Never true    Ran Out of Food in the Last Year:  Never true  Transportation Needs: No Transportation Needs (02/10/2024)   PRAPARE - Administrator, Civil Service (Medical): No    Lack of Transportation (Non-Medical): No  Physical Activity: Insufficiently Active (02/10/2024)   Exercise Vital Sign    Days of Exercise per Week: 4 days    Minutes of Exercise per Session: 20 min  Stress: Stress Concern Present (02/10/2024)   [employer] of Occupational Health - Occupational Stress Questionnaire    Feeling of Stress : To some extent  Social Connections: Moderately Isolated (02/10/2024)   Social Connection and Isolation Panel [NHANES]    Frequency of Communication with Friends and Family: Once a week    Frequency of Social Gatherings with Friends and  Family: Never    Attends Religious Services: 1 to 4 times per year    Active Member of [employer] or Organizations: No    Attends Engineer, structural: Not on file    Marital Status: Married  Intimate Partner Violence: Unknown (02/16/2022)   Received from [employer], [facility]   HITS    Physically Hurt: Not on file    Insult or Talk Down To: Not on file    Threaten Physical Harm: Not on file    Scream or Curse: Not on file   Past Surgical History:  Procedure Laterality Date   ANKLE FRACTURE SURGERY Left 2009 & 2011   BALLOON DILATION N/A 01/05/2022   Procedure: BALLOON DILATION;  Surgeon: Sherrilyn Rist, MD;  Location: [facility];  Service: Gastroenterology;  Laterality: N/A;   BIOPSY  01/05/2022   Procedure: BIOPSY;  Surgeon: Sherrilyn Rist, MD;  Location: [facility];  Service: Gastroenterology;;   CERVICAL SPINE SURGERY  1995   c2 facture, halo fixation   COLONOSCOPY N/A 01/14/2018   Procedure: COLONOSCOPY;  Surgeon: Sherrilyn Rist, MD;  Location: [facility];  Service: Gastroenterology;  Laterality: N/A;   ESOPHAGOGASTRODUODENOSCOPY (EGD) WITH PROPOFOL N/A 01/14/2018   Procedure: ESOPHAGOGASTRODUODENOSCOPY (EGD) WITH PROPOFOL;  Surgeon: Sherrilyn Rist, MD;  Location: [facility];  Service: Gastroenterology;  Laterality: N/A;   ESOPHAGOGASTRODUODENOSCOPY (EGD) WITH PROPOFOL N/A 01/05/2022   Procedure: ESOPHAGOGASTRODUODENOSCOPY (EGD) WITH PROPOFOL;  Surgeon: Sherrilyn Rist, MD;  Location: [facility];  Service: Gastroenterology;  Laterality: N/A;   LUMBAR FUSION  2004   L4-6, fusion   TONSILLECTOMY AND ADENOIDECTOMY     WISDOM TOOTH EXTRACTION     Family History  Problem Relation Age of Onset   Heart disease Mother    Hyperlipidemia Mother    Hypertension Mother    Arthritis Mother    Alcohol abuse Mother    Arthritis Father    COPD Father    Alcohol abuse Father    Bladder Cancer Father    Alcohol abuse Sister    Heart disease Sister     Diabetes Sister    Alcohol abuse Sister    Alcohol abuse Brother    Lung cancer Maternal Grandfather    Colon cancer Neg Hx     Current Outpatient Medications:    acetaminophen (TYLENOL) 500 MG tablet, Take 1,000 mg by mouth 2 (two) times daily as needed for mild pain or moderate pain., Disp: , Rfl:    atorvastatin (LIPITOR) 20 MG tablet, Take 1 tablet (20 mg total) by mouth daily., Disp: 90 tablet, Rfl: 3   DULoxetine (CYMBALTA) 60 MG capsule, Take 1 capsule (60 mg total) by mouth daily. For mood/ musculoskeletal pain, Disp: 90 capsule, Rfl:  3   gabapentin (NEURONTIN) 300 MG capsule, Take 1 capsule (300 mg total) by mouth 3 (three) times daily., Disp: 270 capsule, Rfl: 3   omeprazole (PRILOSEC) 20 MG capsule, TAKE 1 CAPSULE BY MOUTH EVERY DAY, Disp: 90 capsule, Rfl: 3   Tetrahydrozoline HCl (VISINE OP), Place 2 drops into both eyes daily as needed (for dry eyes)., Disp: , Rfl:    traZODone (DESYREL) 50 MG tablet, Take 1-2 tablets (50-100 mg total) by mouth at bedtime as needed for sleep., Disp: 180 tablet, Rfl: 3  Allergies  Allergen Reactions   Penicillins Other (See Comments)    Had a seizure; occurred as an infant; along with sulfa   Sulfa Antibiotics Other (See Comments)    Had a seizure; occurred as an infant; along with pencillin     ROS: Review of Systems Pertinent items noted in HPI and remainder of comprehensive ROS otherwise negative.    Physical exam BP 128/65   Pulse 97   Temp 97.9 F (36.6 C)   Ht 5\' 11"  (1.803 m)   Wt 245 lb (111.1 kg)   SpO2 91%   BMI 34.17 kg/m  General appearance: alert, cooperative, appears stated age, no distress, and moderately obese Head: Normocephalic, without obvious abnormality, atraumatic Eyes: negative findings: lids and lashes normal, conjunctivae and sclerae normal, corneas clear, and pupils equal, round, reactive to light and accomodation Ears: normal TM's and external ear canals both ears Nose: Nares normal. Septum midline.  Mucosa normal. No drainage or sinus tenderness. Throat: lips, mucosa, and tongue normal; teeth and gums normal Neck: no adenopathy, supple, symmetrical, trachea midline, and thyroid not enlarged, symmetric, no tenderness/mass/nodules Back: symmetric, no curvature. ROM normal. No CVA tenderness. Lungs: clear to auscultation bilaterally Chest wall: no tenderness Heart: regular rate and rhythm, S1, S2 normal, no murmur, click, rub or gallop Abdomen:  Protuberant but soft, nontender nondistended.  No palpable masses or hepatosplenomegaly Extremities: extremities normal, atraumatic, no cyanosis or edema Pulses: 2+ and symmetric Skin: Skin color, texture, turgor normal. No rashes or lesions Lymph nodes: Cervical, supraclavicular, and axillary nodes normal. Neurologic: Grossly normal      02/15/2024    8:03 AM 05/14/2023   11:33 AM 04/30/2023    4:32 PM  Depression screen PHQ 2/9  Decreased Interest 0 0 0  Down, Depressed, Hopeless 0 0 0  PHQ - 2 Score 0 0 0  Altered sleeping 0 0 0  Tired, decreased energy 0 1 1  Change in appetite 0 0 0  Feeling bad or failure about yourself  0 0 1  Trouble concentrating 0 1 0  Moving slowly or fidgety/restless 0 0 0  Suicidal thoughts 0 0 0  PHQ-9 Score 0 2 2  Difficult doing work/chores Not difficult at all Not difficult at all Somewhat difficult      02/15/2024    8:03 AM 05/14/2023   11:34 AM 04/30/2023    4:33 PM 04/16/2023    8:41 AM  GAD 7 : Generalized Anxiety Score  Nervous, Anxious, on Edge 0 1 1 2   Control/stop worrying 0 0 0 2  Worry too much - different things 0 0 1 1  Trouble relaxing 0 0 1 1  Restless 0 1 0 1  Easily annoyed or irritable 0 0 1 2  Afraid - awful might happen 0 0 0 2  Total GAD 7 Score 0 2 4 11   Anxiety Difficulty Not difficult at all Not difficult at all  Somewhat difficult  Assessment/ Plan: Catalina Lunger here for annual physical exam.   Annual physical exam  Vaccination refused by patient  Tobacco use  disorder - Plan: Anemia Profile B, Ambulatory Referral Lung Cancer Screening [facility], Urinalysis, Routine w reflex microscopic  Family history of bladder cancer - Plan: Urinalysis, Routine w reflex microscopic  Back pain with history of spinal surgery - Plan: DULoxetine (CYMBALTA) 60 MG capsule, gabapentin (NEURONTIN) 300 MG capsule, methocarbamol (ROBAXIN) 500 MG tablet, diclofenac (VOLTAREN) 75 MG EC tablet  Mixed hyperlipidemia - Plan: Lipid Panel, CMP14+EGFR, atorvastatin (LIPITOR) 20 MG tablet  Abdominal aortic atherosclerosis (HCC) - Plan: Lipid Panel, CMP14+EGFR  Screening for malignant neoplasm of prostate - Plan: PSA  Elevated MCV - Plan: Anemia Profile B  Elevated glucose - Plan: CMP14+EGFR, Bayer DCA Hb A1c Waived  Reactive depression - Plan: DULoxetine (CYMBALTA) 60 MG capsule  Primary insomnia - Plan: traZODone (DESYREL) 50 MG tablet  Gastroesophageal reflux disease without esophagitis - Plan: omeprazole (PRILOSEC) 20 MG capsule  Obesity (BMI 30.0-34.9)  Fasting labs obtained today.  Will also obtain urine specimen given smoking status and family history of bladder cancer.  He is totally asymptomatic.  He  declined vaccinations.  He was amenable to lung cancer screening and referral has been placed.  I have given him Robaxin and Voltaren for as needed use.  Discussed potential side effects and indication for use.  He voiced good understanding and will follow-up as needed this issue.  He should continue statin for prevention of progression of abdominal aortic atherosclerosis.  All meds renewed  Counseled on healthy lifestyle choices, including diet (rich in fruits, vegetables and lean meats and low in salt and simple carbohydrates) and exercise (at least 30 minutes of moderate physical activity daily).  Patient to follow up 1 year  Evalette Montrose M. Nadine Counts, DO

## 2024-02-16 LAB — CMP14+EGFR
ALT: 28 IU/L (ref 0–44)
AST: 21 IU/L (ref 0–40)
Albumin: 4.7 g/dL (ref 3.8–4.9)
Alkaline Phosphatase: 125 IU/L — ABNORMAL HIGH (ref 44–121)
BUN/Creatinine Ratio: 12 (ref 9–20)
BUN: 10 mg/dL (ref 6–24)
Bilirubin Total: 0.7 mg/dL (ref 0.0–1.2)
CO2: 24 mmol/L (ref 20–29)
Calcium: 9.7 mg/dL (ref 8.7–10.2)
Chloride: 99 mmol/L (ref 96–106)
Creatinine, Ser: 0.86 mg/dL (ref 0.76–1.27)
Globulin, Total: 2.3 g/dL (ref 1.5–4.5)
Glucose: 113 mg/dL — ABNORMAL HIGH (ref 70–99)
Potassium: 4.4 mmol/L (ref 3.5–5.2)
Sodium: 138 mmol/L (ref 134–144)
Total Protein: 7 g/dL (ref 6.0–8.5)
eGFR: 100 mL/min/{1.73_m2} (ref >59)

## 2024-02-16 LAB — ANEMIA PROFILE B
Basophils Absolute: 0.1 10*3/uL (ref 0.0–0.2)
Basos: 1 %
EOS (ABSOLUTE): 0.2 10*3/uL (ref 0.0–0.4)
Eos: 2 %
Ferritin: 348 ng/mL (ref 30–400)
Folate: 6.1 ng/mL (ref >3.0)
Hematocrit: 47.9 % (ref 37.5–51.0)
Hemoglobin: 16.4 g/dL (ref 13.0–17.7)
Immature Grans (Abs): 0 10*3/uL (ref 0.0–0.1)
Immature Granulocytes: 0 %
Iron Saturation: 18 % (ref 15–55)
Iron: 67 ug/dL (ref 38–169)
Lymphocytes Absolute: 1.7 10*3/uL (ref 0.7–3.1)
Lymphs: 19 %
MCH: 32 pg (ref 26.6–33.0)
MCHC: 34.2 g/dL (ref 31.5–35.7)
MCV: 94 fL (ref 79–97)
Monocytes Absolute: 0.6 10*3/uL (ref 0.1–0.9)
Monocytes: 6 %
Neutrophils Absolute: 6.6 10*3/uL (ref 1.4–7.0)
Neutrophils: 72 %
Platelets: 258 10*3/uL (ref 150–450)
RBC: 5.12 x10E6/uL (ref 4.14–5.80)
RDW: 12.4 % (ref 11.6–15.4)
Retic Ct Pct: 2.2 % (ref 0.6–2.6)
Total Iron Binding Capacity: 382 ug/dL (ref 250–450)
UIBC: 315 ug/dL (ref 111–343)
Vitamin B-12: 241 pg/mL (ref 232–1245)
WBC: 9.2 10*3/uL (ref 3.4–10.8)

## 2024-02-16 LAB — LIPID PANEL
Chol/HDL Ratio: 3.4 ratio (ref 0.0–5.0)
Cholesterol, Total: 129 mg/dL (ref 100–199)
HDL: 38 mg/dL — ABNORMAL LOW (ref >39)
LDL Chol Calc (NIH): 78 mg/dL (ref 0–99)
Triglycerides: 63 mg/dL (ref 0–149)
VLDL Cholesterol Cal: 13 mg/dL (ref 5–40)

## 2024-02-16 LAB — PSA: Prostate Specific Ag, Serum: 1 ng/mL (ref 0.0–4.0)

## 2024-02-18 ENCOUNTER — Encounter: Payer: Self-pay | Admitting: Family Medicine

## 2024-02-26 ENCOUNTER — Telehealth: Payer: Self-pay | Admitting: *Deleted

## 2024-02-26 ENCOUNTER — Other Ambulatory Visit: Payer: Self-pay | Admitting: *Deleted

## 2024-02-26 DIAGNOSIS — Z87891 Personal history of nicotine dependence: Secondary | ICD-10-CM

## 2024-02-26 DIAGNOSIS — F17210 Nicotine dependence, cigarettes, uncomplicated: Secondary | ICD-10-CM

## 2024-02-26 DIAGNOSIS — Z122 Encounter for screening for malignant neoplasm of respiratory organs: Secondary | ICD-10-CM

## 2024-02-26 NOTE — Telephone Encounter (Signed)
 Lung Cancer Screening Narrative/Criteria Questionnaire (Cigarette Smokers Only- No Cigars/Pipes/vapes)   Nathaniel Mills   SDMV:03/25/24 3:00- [name]                                           03/20/1966              LDCT: 03/25/24 6:00- AP    57 y.o.   Phone: [phone]  Lung Screening Narrative (confirm age 51-77 yrs Medicare / 50-80 yrs Private pay insurance)   Insurance information:Cigna   Referring Provider:G[facility]schalk   This screening involves an initial phone call with a team member from our program. It is called a shared decision making visit. The initial meeting is required by insurance and Medicare to make sure you understand the program. This appointment takes about 15-20 minutes to complete. The CT scan will completed at a separate date/time. This scan takes about 5-10 minutes to complete and you may eat and drink before and after the scan.  Criteria questions for Lung Cancer Screening:   Are you a current or former smoker? Current Age began smoking: 14   If you are a former smoker, what year did you quit smoking? (within 15 yrs)   To calculate your smoking history, I need an accurate estimate of how many packs of cigarettes you smoked per day and for how many years. (Not just the number of PPD you are now smoking)   Years smoking 43 x Packs per day 1 = Pack years 43   (at least 20 pack yrs)   (Make sure they understand that we need to know how much they have smoked in the past, not just the number of PPD they are smoking now)  Do you have a personal history of cancer?  No    Do you have a family history of cancer? Yes  (cancer type and and relative) father (bladder) MGF (Lung)  Are you coughing up blood?  No  Have you had unexplained weight loss of 15 lbs or more in the last 6 months? No  It looks like you meet all criteria.     Additional information: N/A

## 2024-03-25 ENCOUNTER — Ambulatory Visit (HOSPITAL_COMMUNITY)
Admission: RE | Admit: 2024-03-25 | Discharge: 2024-03-25 | Disposition: A | Discharge status: Home / Self Care | Source: Ambulatory Visit | Attending: Acute Care | Admitting: Acute Care

## 2024-03-25 ENCOUNTER — Ambulatory Visit (INDEPENDENT_AMBULATORY_CARE_PROVIDER_SITE_OTHER): Admitting: Acute Care

## 2024-03-25 ENCOUNTER — Encounter: Payer: Self-pay | Admitting: Acute Care

## 2024-03-25 DIAGNOSIS — F17210 Nicotine dependence, cigarettes, uncomplicated: Secondary | ICD-10-CM | POA: Insufficient documentation

## 2024-03-25 DIAGNOSIS — Z122 Encounter for screening for malignant neoplasm of respiratory organs: Secondary | ICD-10-CM | POA: Insufficient documentation

## 2024-03-25 DIAGNOSIS — Z87891 Personal history of nicotine dependence: Secondary | ICD-10-CM | POA: Diagnosis present

## 2024-03-25 NOTE — Patient Instructions (Signed)

## 2024-03-25 NOTE — Progress Notes (Signed)
  Virtual Visit via Telephone Note  I connected with Nathaniel Mills on 03/25/24 at  3:00 PM EDT by telephone and verified that I am speaking with the correct person using two identifiers.  Location: Patient:  At home Provider: [address], [place], [place], [address]    I discussed the limitations, risks, security and privacy concerns of performing an evaluation and management service by telephone and the availability of in person appointments. I also discussed with the patient that there may be a patient responsible charge related to this service. The patient expressed understanding and agreed to proceed.   Shared Decision Making Visit Lung Cancer Screening Program [phone])   Eligibility: Age 58 y.o. Pack Years Smoking History Calculation 42 pack year smoking history (# packs/per year x # years smoked) Recent History of coughing up blood  no Unexplained weight loss? no ( >Than 15 pounds within the last 6 months ) Prior History Lung / other cancer no (Diagnosis within the last 5 years already requiring surveillance chest CT Scans). Smoking Status Current Smoker Former Smokers: Years since quit:  NA  Quit Date:  NA  Visit Components: Discussion included one or more decision making aids. yes Discussion included risk/benefits of screening. yes Discussion included potential follow up diagnostic testing for abnormal scans. yes Discussion included meaning and risk of over diagnosis. yes Discussion included meaning and risk of False Positives. yes Discussion included meaning of total radiation exposure. yes  Counseling Included: Importance of adherence to annual lung cancer LDCT screening. yes Impact of comorbidities on ability to participate in the program. yes Ability and willingness to under diagnostic treatment. yes  Smoking Cessation Counseling: Current Smokers:  Discussed importance of smoking cessation. yes Information about tobacco cessation classes and  interventions provided to patient. yes Patient provided with "ticket" for LDCT Scan. yes Symptomatic Patient. no  Counseling NA Diagnosis Code: Tobacco Use Z72.0 Asymptomatic Patient yes  Counseling (Intermediate counseling: > three minutes counseling) B2841 Former Smokers:  Discussed the importance of maintaining cigarette abstinence. yes Diagnosis Code: Personal History of Nicotine Dependence. L24.401 Information about tobacco cessation classes and interventions provided to patient. Yes Patient provided with "ticket" for LDCT Scan. yes Written Order for Lung Cancer Screening with LDCT placed in Epic. Yes (CT Chest Lung Cancer Screening Low Dose W/O CM) [id number] Z12.2-Screening of respiratory organs Z87.891-Personal history of nicotine dependence   Raejean Bullock, NP

## 2024-04-02 ENCOUNTER — Other Ambulatory Visit: Payer: Self-pay | Admitting: Family Medicine

## 2024-04-02 DIAGNOSIS — Z9889 Other specified postprocedural states: Secondary | ICD-10-CM

## 2024-04-15 ENCOUNTER — Other Ambulatory Visit: Payer: Self-pay

## 2024-04-15 DIAGNOSIS — Z122 Encounter for screening for malignant neoplasm of respiratory organs: Secondary | ICD-10-CM

## 2024-04-15 DIAGNOSIS — F17210 Nicotine dependence, cigarettes, uncomplicated: Secondary | ICD-10-CM

## 2024-04-15 DIAGNOSIS — Z87891 Personal history of nicotine dependence: Secondary | ICD-10-CM

## 2024-09-08 ENCOUNTER — Other Ambulatory Visit: Payer: Self-pay | Admitting: Family Medicine

## 2024-09-08 DIAGNOSIS — Z9889 Other specified postprocedural states: Secondary | ICD-10-CM

## 2024-09-26 ENCOUNTER — Ambulatory Visit: Admitting: Family

## 2024-09-26 ENCOUNTER — Encounter: Payer: Self-pay | Admitting: Family

## 2024-09-26 VITALS — BP 136/84 | HR 80 | Temp 97.2°F | Ht 71.0 in | Wt 253.6 lb

## 2024-09-26 DIAGNOSIS — R140 Abdominal distension (gaseous): Secondary | ICD-10-CM

## 2024-09-26 DIAGNOSIS — K921 Melena: Secondary | ICD-10-CM

## 2024-09-26 DIAGNOSIS — S00412A Abrasion of left ear, initial encounter: Secondary | ICD-10-CM

## 2024-09-26 DIAGNOSIS — R109 Unspecified abdominal pain: Secondary | ICD-10-CM | POA: Diagnosis not present

## 2024-09-26 LAB — CBC WITH DIFFERENTIAL/PLATELET
Basophils Absolute: 0.1 x10E3/uL (ref 0.0–0.2)
Basos: 1 %
EOS (ABSOLUTE): 0.2 x10E3/uL (ref 0.0–0.4)
Eos: 3 %
Hematocrit: 49 % (ref 37.5–51.0)
Hemoglobin: 16.8 g/dL (ref 13.0–17.7)
Immature Grans (Abs): 0.1 x10E3/uL (ref 0.0–0.1)
Immature Granulocytes: 1 %
Lymphocytes Absolute: 2.7 x10E3/uL (ref 0.7–3.1)
Lymphs: 35 %
MCH: 32.9 pg (ref 26.6–33.0)
MCHC: 34.3 g/dL (ref 31.5–35.7)
MCV: 96 fL (ref 79–97)
Monocytes Absolute: 0.6 x10E3/uL (ref 0.1–0.9)
Monocytes: 7 %
Neutrophils Absolute: 4.2 x10E3/uL (ref 1.4–7.0)
Neutrophils: 53 %
Platelets: 279 x10E3/uL (ref 150–450)
RBC: 5.1 x10E6/uL (ref 4.14–5.80)
RDW: 12.8 % (ref 11.6–15.4)
WBC: 7.8 x10E3/uL (ref 3.4–10.8)

## 2024-09-26 NOTE — Progress Notes (Signed)
 Subjective:    Patient ID: Nathaniel Mills, male    DOB: April 29, 1966, 58 y.o.   MRN: [id number]  Chief Complaint  Patient presents with   blood in left ear   Abdominal Pain   Blood In Stools   PT presents to the office today with blood in stool that he noticed yesterday. Reports he has not noticed any today. Denies any pain with BM.   States after his shower yesterday, he cleaned his ear and noticed blood on his Q-tip.  Abdominal Pain This is a recurrent problem. The current episode started more than 1 year ago. The problem occurs intermittently. The pain is located in the generalized abdominal region. The pain is mild. The quality of the pain is dull. Associated symptoms include belching, constipation, diarrhea, flatus and melena. Pertinent negatives include no fever, hematochezia, hematuria, myalgias, nausea, vomiting or weight loss.      Review of Systems  Constitutional:  Negative for fever and weight loss.  Gastrointestinal:  Positive for abdominal pain, constipation, diarrhea, flatus and melena. Negative for hematochezia, nausea and vomiting.  Genitourinary:  Negative for hematuria.  Musculoskeletal:  Negative for myalgias.  All other systems reviewed and are negative.   Social History   Socioeconomic History   Marital status: Married    Spouse name: Not on file   Number of children: 1   Years of education: Not on file   Highest education level: Associate degree: occupational, scientist, product/process development, or vocational program  Occupational History   Occupation: physiological scientist  Tobacco Use   Smoking status: Every Day    Current packs/day: 1.00    Average packs/day: 1 pack/day for 35.0 years (35.0 ttl pk-yrs)    Types: Cigars, Cigarettes   Smokeless tobacco: Never  Vaping Use   Vaping status: Never Used  Substance and Sexual Activity   Alcohol use: Not Currently    Alcohol/week: 6.0 standard drinks of alcohol    Types: 6 Cans of beer per week    Comment: 4 x a week, as of  12/30/2021 none in 6 months   Drug use: No   Sexual activity: Yes  Other Topics Concern   Not on file  Social History Narrative   Not on file   Social Drivers of Health   Financial Resource Strain: Low Risk  (09/25/2024)   Overall Financial Resource Strain (CARDIA)    Difficulty of Paying Living Expenses: Not hard at all  Food Insecurity: No Food Insecurity (09/25/2024)   Hunger Vital Sign    Worried About Running Out of Food in the Last Year: Never true    Ran Out of Food in the Last Year: Never true  Transportation Needs: No Transportation Needs (09/25/2024)   PRAPARE - Administrator, Civil Service (Medical): No    Lack of Transportation (Non-Medical): No  Physical Activity: Insufficiently Active (09/25/2024)   Exercise Vital Sign    Days of Exercise per Week: 5 days    Minutes of Exercise per Session: 10 min  Stress: No Stress Concern Present (09/25/2024)   [employer] of Occupational Health - Occupational Stress Questionnaire    Feeling of Stress: Not at all  Social Connections: Unknown (09/25/2024)   Social Connection and Isolation Panel    Frequency of Communication with Friends and Family: Three times a week    Frequency of Social Gatherings with Friends and Family: Patient declined    Attends Religious Services: Patient declined    Database Administrator or Organizations: Patient  declined    Attends Banker Meetings: Not on file    Marital Status: Married   Family History  Problem Relation Age of Onset   Heart disease Mother    Hyperlipidemia Mother    Hypertension Mother    Arthritis Mother    Alcohol abuse Mother    Arthritis Father    COPD Father    Alcohol abuse Father    Bladder Cancer Father    Alcohol abuse Sister    Heart disease Sister    Diabetes Sister    Alcohol abuse Sister    Alcohol abuse Brother    Lung cancer Maternal Grandfather    Colon cancer Neg Hx         Objective:   Physical Exam Vitals  reviewed.  Constitutional:      General: He is not in acute distress.    Appearance: He is well-developed.  HENT:     Head: Normocephalic.     Right Ear: Tympanic membrane and external ear normal.     Left Ear: Tympanic membrane and external ear normal.     Ears:     Comments: Small scabbed area on left canal Eyes:     General:        Right eye: No discharge.        Left eye: No discharge.     Pupils: Pupils are equal, round, and reactive to light.  Neck:     Thyroid : No thyromegaly.  Cardiovascular:     Rate and Rhythm: Normal rate and regular rhythm.     Heart sounds: Normal heart sounds. No murmur heard. Pulmonary:     Effort: Pulmonary effort is normal. No respiratory distress.     Breath sounds: Normal breath sounds. No wheezing.  Abdominal:     General: Bowel sounds are normal. There is no distension.     Palpations: Abdomen is soft.     Tenderness: There is no abdominal tenderness.  Musculoskeletal:        General: No tenderness. Normal range of motion.     Cervical back: Normal range of motion and neck supple.  Skin:    General: Skin is warm and dry.     Findings: No erythema or rash.  Neurological:     Mental Status: He is alert and oriented to person, place, and time.     Cranial Nerves: No cranial nerve deficit.     Deep Tendon Reflexes: Reflexes are normal and symmetric.  Psychiatric:        Behavior: Behavior normal.        Thought Content: Thought content normal.        Judgment: Judgment normal.       BP 136/84   Pulse 80   Temp (!) 97.2 F (36.2 C) (Temporal)   Ht 5' 11 (1.803 m)   Wt 253 lb 9.6 oz (115 kg)   BMI 35.37 kg/m      Assessment & Plan:  Demontrae Gilbert comes in today with chief complaint of blood in left ear, Abdominal Pain, and Blood In Stools   Diagnosis and orders addressed:  1. Blood in stool (Primary) - Ambulatory referral to Gastroenterology - CBC with Differential/Platelet  2. Abdominal pain, unspecified abdominal  location - Ambulatory referral to Gastroenterology - CBC with Differential/Platelet  3. Bloating - Ambulatory referral to Gastroenterology - CBC with Differential/Platelet  4. Abrasion of left ear canal, initial encounter Avoid picking    Labs pending Referral to GI pending  CBC pending  Will need colonoscopy  Keep follow up with PCP   Bari Learn, FNP

## 2024-09-26 NOTE — Patient Instructions (Signed)
Irritable Bowel Syndrome, Adult  Irritable bowel syndrome (IBS) is a group of symptoms that affects the organs responsible for digestion (gastrointestinal tract, or GI tract). IBS is not one specific disease. To regulate how the GI tract works, the body sends signals back and forth between the intestines and the brain. If you have IBS, there may be a problem with these signals. As a result, the GI tract does not function normally. The intestines may become more sensitive and overreact to certain things. This may be especially true when you eat certain foods or when you are under stress. There are four main types of IBS. These may be determined based on the consistency of your stool (feces): IBS with mostly (predominance of) diarrhea. IBS with predominance of constipation. IBS with mixed bowel habits. This includes both diarrhea and constipation. IBS unclassified. This includes IBS that cannot be categorized into one of the other three main types. It is important to know which type of IBS you have. Certain treatments are more likely to be helpful for certain types of IBS. What are the causes? The exact cause of IBS is not known. What increases the risk? You may have a higher risk for IBS if you: Are male. Are younger than 40 years. Have a family history of IBS. Have a mental health condition, such as depression, anxiety, or post-traumatic stress disorder. Have had a bacterial infection of your GI tract. What are the signs or symptoms? Symptoms of IBS vary from person to person. The main symptom is abdominal pain or discomfort. Other symptoms usually include one or more of the following: Diarrhea, constipation, or both. Swelling or bloating in the abdomen. Feeling full after eating a small or regular-sized meal. Frequent gas. Mucus in the stool. A feeling of having more stool left after a bowel movement. Symptoms tend to come and go. They may be triggered by stress, mental health  conditions, or certain foods. How is this diagnosed? This condition may be diagnosed based on a physical exam, your medical history, and your symptoms. You may have tests, such as: Blood tests. Stool test. Colonoscopy. This is a procedure in which your GI tract is viewed with a long, thin, flexible tube. How is this treated? There is no cure for IBS, but treatment can help relieve symptoms. Treatment depends on the type of IBS you have, and may include: Changes to your diet, such as: Avoiding foods that cause symptoms. Drinking more water. Following a low-FODMAP (fermentable oligosaccharides, disaccharides, monosaccharides, and polyols) diet for up to 6 weeks, or as told by your health care provider. FODMAPs are sugars that are hard for some people to digest. Eating more fiber. Eating small meals at the same times every day. Medicines. These may include: Fiber supplements, if you have constipation. Medicine to control diarrhea (antidiarrheal medicines). Medicine to help control muscle tightening (spasms) in your GI tract (antispasmodic medicines). Medicines to help with mental health conditions, such as antidepressants. Talk therapy or counseling. Working with a dietitian to help create a food plan that is right for you. Managing your stress. Follow these instructions at home: Eating and drinking  Eat a healthy diet. Eat 5-6 small meals a day. Try to eat meals at about the same times each day. Do not eat large meals. Gradually eat more fiber-rich foods. These include whole grains, fruits, and vegetables. This may be especially helpful if you have IBS with constipation. Eat a diet low in FODMAPs. You may need to avoid foods such as   citrus fruits, cabbage, garlic, and onions. Drink enough fluid to keep your urine pale yellow. Keep a journal of foods that seem to trigger symptoms. Avoid foods and drinks that: Contain added sugar. Make your symptoms worse. These may include dairy  products, caffeinated drinks, and carbonated drinks. Alcohol use Do not drink alcohol if: Your health care provider tells you not to drink. You are pregnant, may be pregnant, or are planning to become pregnant. If you drink alcohol: Limit how much you have to: 0-1 drink a day for women. 0-2 drinks a day for men. Know how much alcohol is in your drink. In the [place] one drink equals one 12 oz bottle of beer (355 mL), one 5 oz glass of wine (148 mL), or one 1 oz glass of hard liquor (44 mL) General instructions Take over-the-counter and prescription medicines only as told by your health care provider. This includes supplements. Get enough exercise. Do at least 150 minutes of moderate-intensity exercise each week. Manage your stress. Getting enough sleep and exercise can help you manage stress. Keep all follow-up visits. This is important. This includes all visits with your health care provider and therapist. Where to find more information International Foundation for Functional Gastrointestinal Disorders: aboutibs.org National Institute of Diabetes and Digestive and Kidney Diseases: niddk.nih.gov Contact a health care provider if: You have constant pain. You lose weight. You have diarrhea that gets worse. You have bleeding from the rectum. You vomit often. You have a fever. Get help right away if: You have severe abdominal pain. You have diarrhea with symptoms of dehydration, such as dizziness or dry mouth. You have bloody or black stools. You have severe abdominal bloating. You have vomiting that does not stop. You have blood in your vomit. Summary Irritable bowel syndrome (IBS) is not one specific disease. It is a group of symptoms that affects digestion. Your intestines may become more sensitive and overreact to certain things. This may be especially true when you eat certain foods or when you are under stress. There is no cure for IBS, but treatment can help relieve  symptoms. This information is not intended to replace advice given to you by your health care provider. Make sure you discuss any questions you have with your health care provider. Document Revised: 10/12/2021 Document Reviewed: 10/12/2021 Elsevier Patient Education  2024 Elsevier Inc.  

## 2024-09-29 ENCOUNTER — Ambulatory Visit: Payer: Self-pay | Admitting: Family

## 2024-09-29 ENCOUNTER — Encounter: Payer: Self-pay | Admitting: Gastroenterology

## 2024-10-28 NOTE — Progress Notes (Unsigned)
 GI Office Note    Referring Provider: Jolinda Norene HERO, DO Primary Care Physician:  Lavell Bari LABOR, FNP  Primary Gastroenterologist: Carlin POUR. Cindie, DO (previously Dr. Legrand with [facility])  Chief Complaint   Chief Complaint  Patient presents with   Abdominal Pain    Stomach pains, gas, bloating. Alternating diarrhea and constipation    History of Present Illness   Nathaniel Mills is a 58 y.o. male presenting today at the request of Gottschalk, Ashly M, DO for abdominal pain and diarrhea.  Colonoscopy by. Dr. Legrand 2019: - One 8 mm polyp in the cecum, removed with a cold snare. Resected and retrieved.  - Diverticulosis in the left colon.  - The examination was otherwise normal on direct and retroflexion views. - Path: sessile serrated without dysplasia.  - Repeat colonoscopy in  EGD 2019 - recommended repeat EGD in 3 years.   Last seen by GI at [facility] with OV Feb 2023.  He noted progressive dysphagia, and take smaller bites more frequently and also had a cough within the last 24 to 36 hours and unable to swallow his own spit as well as having odynophagia.  Noted to be on Prilosec 20 mg daily.  He denied NSAIDs and EtOH use and also not on any anticoagulants.  Can have belching with regurgitation at times.  He was scheduled for EGD with dilation as well as repeat colonoscopy.  He ended up being scheduled for both.  Dr. Legrand reviewed colon pathology from 2019 and recommended 5 year follow up instead of 3 for colonoscopy. These telephone notes also indicate patient has had difficult airway in the past and needed to be done in the hospital setting.  EGD only was performed.  EGD Feb 2023: - Esophageal mucosal changes secondary to established short- segment Barrett' s disease. - Benign- appearing esophageal stenosis. Dilated.  - 1- 2 cm hiatal hernia.  - Normal stomach.  - Normal examined duodenum. - Path: barretts confirmed w/o dysplasia. - Repeat in 5 years.   Seen by PCP  09/26/24 with concern of abdominal pain and blood in stool day prior. Pain reported as generalized and mild. Having belching, constipation, diarrhea, melena, and flatulence. Reported bloating as well. Referred to GI.   CBC 09/26/24 with normal hemoglobin.   Today:  Discussed the use of AI scribe software for clinical note transcription with the patient, who gave verbal consent to proceed.  Over the past two months, he has experienced worsening gastrointestinal symptoms, including frequent diarrhea and gas. His stools are watery and often contain partially digested food, particularly vegetables like tomatoes and lettuce. He describes need to have a bowel movement occurring approximately 15 minutes after eating. Despite eliminating dairy and reducing carbohydrate intake, his symptoms have not improved.  He has a history of Barrett's esophagus and has been taking omeprazole  20 mg daily.  He continues to experience upper abdominal burning but no overt pain.  No issues with odynophagia, nausea, or vomiting.  He has not been using any over-the-counter medications like Pepto Bismol to manage his diarrhea. No significant bleeding is noted, though he did notice dark brown, almost black stools at one point, and occasional blood when wiping, he is unaware of any hemorrhoids.  His bowel habits have changed from being mostly constipated, with occasional constipation after consuming cheese, to experiencing urgent diarrhea primarily in the morning and after meals. No straining during bowel movements and no nocturnal symptoms are reported.  He has a history of esophageal issues, having  undergone dilation twice for a contracted esophageal valve. He avoids dry foods like pork and chicken breast unless they are heavily sauced, as they exacerbate his symptoms.  He underwent back surgery in the past, during which his intestines were manipulated, and he has experienced fluctuating bowel habits since then. He denies any  family history of pancreatic, gallbladder, or colon cancer, though he has had polyps in the past and is due for a colonoscopy.      Wt Readings from Last 6 Encounters:  10/29/24 253 lb (114.8 kg)  09/26/24 253 lb 9.6 oz (115 kg)  02/15/24 245 lb (111.1 kg)  02/22/23 232 lb (105.2 kg)  07/14/22 223 lb 6.4 oz (101.3 kg)  01/05/22 215 lb (97.5 kg)    Body mass index is 35.79 kg/m.  Current Outpatient Medications  Medication Sig Dispense Refill   acetaminophen (TYLENOL) 500 MG tablet Take 1,000 mg by mouth 2 (two) times daily as needed for mild pain or moderate pain.     atorvastatin  (LIPITOR) 20 MG tablet Take 1 tablet (20 mg total) by mouth daily. 90 tablet 3   diclofenac  (VOLTAREN ) 75 MG EC tablet TAKE 1 TABLET BY MOUTH TWICE DAILY AS NEEDED MODERATE PAIN (SCORE 4-6) TAKE WITH FOOD, NO OTHER NSAIDS 30 tablet 0   DULoxetine  (CYMBALTA ) 60 MG capsule Take 1 capsule (60 mg total) by mouth daily. For mood/ musculoskeletal pain 90 capsule 3   methocarbamol  (ROBAXIN ) 500 MG tablet TAKE 1 TABLET BY MOUTH EVERY 8 HOURS AS NEEDED FOR MUSCLE SPASM 30 tablet 0   gabapentin  (NEURONTIN ) 300 MG capsule Take 1 capsule (300 mg total) by mouth 3 (three) times daily. (Patient not taking: Reported on 10/29/2024) 270 capsule 3   omeprazole  (PRILOSEC) 20 MG capsule TAKE 1 CAPSULE BY MOUTH EVERY DAY 90 capsule 3   Tetrahydrozoline HCl (VISINE OP) Place 2 drops into both eyes daily as needed (for dry eyes).     traZODone  (DESYREL ) 50 MG tablet Take 1-2 tablets (50-100 mg total) by mouth at bedtime as needed for sleep. 180 tablet 3   No current facility-administered medications for this visit.    Past Medical History:  Diagnosis Date   Allergy    Barrett's esophagus    Blood transfusion without reported diagnosis 2004   Diverticulosis    Esophageal stenosis    GERD (gastroesophageal reflux disease) 2004-2012   Hyperlipidemia    Lumbar spinal stenosis    Sessile colonic polyp    Status post dilation of  esophageal narrowing     Past Surgical History:  Procedure Laterality Date   ANKLE FRACTURE SURGERY Left 2009 & 2011   BALLOON DILATION N/A 01/05/2022   Procedure: BALLOON DILATION;  Surgeon: Legrand Victory LITTIE DOUGLAS, MD;  Location: [facility];  Service: Gastroenterology;  Laterality: N/A;   BIOPSY  01/05/2022   Procedure: BIOPSY;  Surgeon: Legrand Victory LITTIE DOUGLAS, MD;  Location: [facility];  Service: Gastroenterology;;   CERVICAL SPINE SURGERY  1995   c2 facture, halo fixation   COLONOSCOPY N/A 01/14/2018   Procedure: COLONOSCOPY;  Surgeon: Legrand Victory LITTIE DOUGLAS, MD;  Location: [facility];  Service: Gastroenterology;  Laterality: N/A;   ESOPHAGOGASTRODUODENOSCOPY (EGD) WITH PROPOFOL  N/A 01/14/2018   Procedure: ESOPHAGOGASTRODUODENOSCOPY (EGD) WITH PROPOFOL ;  Surgeon: Legrand Victory LITTIE DOUGLAS, MD;  Location: [facility];  Service: Gastroenterology;  Laterality: N/A;   ESOPHAGOGASTRODUODENOSCOPY (EGD) WITH PROPOFOL  N/A 01/05/2022   Procedure: ESOPHAGOGASTRODUODENOSCOPY (EGD) WITH PROPOFOL ;  Surgeon: Legrand Victory LITTIE DOUGLAS, MD;  Location: [facility];  Service: Gastroenterology;  Laterality: N/A;   LUMBAR FUSION  2004   L4-6, fusion   TONSILLECTOMY AND ADENOIDECTOMY     WISDOM TOOTH EXTRACTION      Family History  Problem Relation Age of Onset   Heart disease Mother    Hyperlipidemia Mother    Hypertension Mother    Arthritis Mother    Alcohol abuse Mother    Arthritis Father    COPD Father    Alcohol abuse Father    Bladder Cancer Father    Alcohol abuse Sister    Heart disease Sister    Diabetes Sister    Alcohol abuse Sister    Alcohol abuse Brother    Lung cancer Maternal Grandfather    Colon cancer Neg Hx     Allergies as of 10/29/2024 - Review Complete 10/29/2024  Allergen Reaction Noted   Penicillins Other (See Comments) 02/03/2016   Sulfa antibiotics Other (See Comments) 02/03/2016    Social History   Socioeconomic History   Marital status: Married    Spouse name: Not on file    Number of children: 1   Years of education: Not on file   Highest education level: Associate degree: occupational, scientist, product/process development, or vocational program  Occupational History   Occupation: physiological scientist  Tobacco Use   Smoking status: Every Day    Current packs/day: 1.00    Average packs/day: 1 pack/day for 35.0 years (35.0 ttl pk-yrs)    Types: Cigars, Cigarettes   Smokeless tobacco: Never  Vaping Use   Vaping status: Never Used  Substance and Sexual Activity   Alcohol use: Not Currently    Alcohol/week: 6.0 standard drinks of alcohol    Types: 6 Cans of beer per week    Comment: 4 x a week, as of 12/30/2021 none in 6 months   Drug use: No   Sexual activity: Yes  Other Topics Concern   Not on file  Social History Narrative   Not on file   Social Drivers of Health   Tobacco Use: High Risk (09/26/2024)   Patient History    Smoking Tobacco Use: Every Day    Smokeless Tobacco Use: Never    Passive Exposure: Not on file  Financial Resource Strain: Low Risk (09/25/2024)   Overall Financial Resource Strain (CARDIA)    Difficulty of Paying Living Expenses: Not hard at all  Food Insecurity: No Food Insecurity (09/25/2024)   Epic    Worried About Programme Researcher, Broadcasting/film/video in the Last Year: Never true    Ran Out of Food in the Last Year: Never true  Transportation Needs: No Transportation Needs (09/25/2024)   Epic    Lack of Transportation (Medical): No    Lack of Transportation (Non-Medical): No  Physical Activity: Insufficiently Active (09/25/2024)   Exercise Vital Sign    Days of Exercise per Week: 5 days    Minutes of Exercise per Session: 10 min  Stress: No Stress Concern Present (09/25/2024)   [employer] of Occupational Health - Occupational Stress Questionnaire    Feeling of Stress: Not at all  Social Connections: Unknown (09/25/2024)   Social Connection and Isolation Panel    Frequency of Communication with Friends and Family: Three times a week    Frequency of  Social Gatherings with Friends and Family: Patient declined    Attends Religious Services: Patient declined    Active Member of Clubs or Organizations: Patient declined    Attends Banker Meetings: Not on file  Marital Status: Married  Catering Manager Violence: Unknown (02/16/2022)   Received from [facility]   HITS    Physically Hurt: Not on file    Insult or Talk Down To: Not on file    Threaten Physical Harm: Not on file    Scream or Curse: Not on file  Depression (PHQ2-9): Low Risk (02/15/2024)   Depression (PHQ2-9)    PHQ-2 Score: 0  Alcohol Screen: Low Risk (09/25/2024)   Alcohol Screen    Last Alcohol Screening Score (AUDIT): 3  Housing: Low Risk (09/25/2024)   Epic    Unable to Pay for Housing in the Last Year: No    Number of Times Moved in the Last Year: 0    Homeless in the Last Year: No  Utilities: Not on file  Health Literacy: Not on file    Review of Systems   Gen: Denies any fever, chills, fatigue, weight loss, lack of appetite.  CV: Denies chest pain, heart palpitations, peripheral edema, syncope.  Resp: Denies shortness of breath at rest or with exertion. Denies wheezing or cough.  GI: see HPI GU : Denies urinary burning, urinary frequency, urinary hesitancy MS: + Joint pain.  denies muscle weakness, cramps, or limitation of movement.  Derm: Denies rash, itching, dry skin Psych: Denies depression, anxiety, memory loss, and confusion Heme: Denies bruising, bleeding, and enlarged lymph nodes.  Physical Exam   Temp 98.7 F (37.1 C) (Temporal)   Ht 5' 10.5 (1.791 m)   Wt 253 lb (114.8 kg)   BMI 35.79 kg/m   General:   Alert and oriented. Pleasant and cooperative. Well-nourished and well-developed.  Head:  Normocephalic and atraumatic. Eyes:  Without icterus, sclera clear and conjunctiva pink.  Ears:  Normal auditory acuity. Mouth:  No deformity or lesions, oral mucosa pink.  Lungs:  Clear to auscultation bilaterally. No wheezes, rales,  or rhonchi. No distress.  Heart:  S1, S2 present without murmurs appreciated.  Abdomen:  +BS, soft, non-tender and non-distended. No HSM noted. No guarding or rebound. No masses appreciated.  Midline surgical scar noted.  Rectal:  deferred given colonoscopy being performed. Msk:  Symmetrical without gross deformities. Normal posture. Extremities:  Without edema. Neurologic:  Alert and  oriented x4;  grossly normal neurologically. Skin:  Intact without significant lesions or rashes. Psych:  Alert and cooperative. Normal mood and affect.  Assessment & Plan   Zephaniah Lubrano is a 58 y.o. male with a history of dysphagia, GERD, Barrett's esophagus, and colon polyps presenting today with complaints of epigastric burning and more recent postprandial diarrhea with history of constipation.    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 Symptoms have worsened over the past two months. Differential diagnosis includes pancreatic insufficiency, gallbladder dysfunction, or inflammatory bowel disease. History of colonic polyps and previous colonoscopy in 2019 and currently overdue.  No significant family history of pancreatic or gallbladder disease. No current use of NSAIDs or other medications that could exacerbate symptoms. Previous back surgery may contribute to gastrointestinal symptoms due to potential adhesions.  Can assess for celiac with the upper endoscopy. - Ordered colonoscopy to evaluate for underlying inflammatory process and assess for polyps. - Ordered fecal elastase to assess for EPI as well as fecal calprotectin to check for chronic inflammation. - Recommended fiber supplementation with Metamucil to bulk stools. - Will consider digestive enzyme supplementation if stool study indicates deficiency. - Consider further biliary workup in the future or treat  for bile acid diarrhea if all current  testing negative.   Barrett's esophagus Previous upper endoscopy. Current symptoms include epigastric burning, possibly related to acid reflux. Omeprazole  dose is currently low, with room for increase to manage symptoms. Potential for H. pylori infection, which can be contracted at any time, necessitating biopsy during endoscopy. - Increased omeprazole  to 40 mg daily. - Scheduled upper endoscopy with biopsy to assess for H. pylori infection and early barretts surveillance  Gastroesophageal reflux with epigastric burning Gastroesophageal reflux disease with epigastric burning, exacerbated by dietary factors. Current management with low-dose omeprazole  is insufficient. Differentials: esophagitis, gastritis, duodenitis, peptic ulcer disease, H. Pylori. Potential for breakthrough symptoms requiring additional management. - Increased omeprazole  to 40 mg daily. - Recommended Pepcid for breakthrough symptoms as needed. - GERD diet  History of colonic polyps, recent rectal bleeding Colonic polyps with previous colonoscopy in 2022. Current symptoms of diarrhea and urgency warrant further evaluation to rule out recurrence or new polyps. Rectal bleeding occurred recently without any known hemorrhoids but was self-limiting. Denied straining but bleeding was self limiting. Monitor for recurrent rectal bleeding - Scheduled colonoscopy to evaluate for polyps and assess for underlying causes of diarrhea ss well as rectal bleeding     Procedure order: Proceed with upper endoscopy and colonoscopy with propofol  by Dr. Cindie  in near future: the risks, benefits, and alternatives have been discussed with the patient in detail. The patient states understanding and desires to proceed. ASA 2  Follow up   Follow up post procedures.   Nathaniel Melia, MSN, FNP-BC, AGACNP-BC [facility]

## 2024-10-29 ENCOUNTER — Telehealth: Payer: Self-pay | Admitting: *Deleted

## 2024-10-29 ENCOUNTER — Ambulatory Visit: Admitting: Gastroenterology

## 2024-10-29 ENCOUNTER — Encounter: Payer: Self-pay | Admitting: Gastroenterology

## 2024-10-29 ENCOUNTER — Encounter: Payer: Self-pay | Admitting: *Deleted

## 2024-10-29 ENCOUNTER — Other Ambulatory Visit: Payer: Self-pay | Admitting: *Deleted

## 2024-10-29 VITALS — BP 138/83 | HR 76 | Temp 98.7°F | Ht 70.5 in | Wt 253.0 lb

## 2024-10-29 DIAGNOSIS — Z860100 Personal history of colon polyps, unspecified: Secondary | ICD-10-CM

## 2024-10-29 DIAGNOSIS — K219 Gastro-esophageal reflux disease without esophagitis: Secondary | ICD-10-CM

## 2024-10-29 DIAGNOSIS — K529 Noninfective gastroenteritis and colitis, unspecified: Secondary | ICD-10-CM

## 2024-10-29 DIAGNOSIS — K625 Hemorrhage of anus and rectum: Secondary | ICD-10-CM | POA: Diagnosis not present

## 2024-10-29 DIAGNOSIS — K2270 Barrett's esophagus without dysplasia: Secondary | ICD-10-CM | POA: Diagnosis not present

## 2024-10-29 DIAGNOSIS — R197 Diarrhea, unspecified: Secondary | ICD-10-CM

## 2024-10-29 DIAGNOSIS — R1013 Epigastric pain: Secondary | ICD-10-CM

## 2024-10-29 MED ORDER — OMEPRAZOLE 40 MG PO CPDR: 40.0000 mg | DELAYED_RELEASE_CAPSULE | Freq: Every day | ORAL | 3 refills | Status: AC

## 2024-10-29 NOTE — Patient Instructions (Signed)
 We are getting scheduled for an upper endoscopy and colonoscopy in the near future with Dr. Cindie.  To help with the diarrhea and give bulk to your stools and help with consistency I recommend you starting a fiber supplementation like Metamucil (psyllium).     If you take the powder you should take 1 tablespoon once daily in at least 8 oz of water.  In regards to capsules you should take 3-5 daily.  You may initially have a little extra bloating and gas from this.  Avoid/limit gas-producing foods (eg, cabbage, legumes, onions, broccoli, brussel sprouts, wheat, and potatoes).   To help with your epigastric/upper stomach burning I am increasing your omeprazole  to 40 mg once daily.  You should take this about 30 minutes prior to breakfast and if you are having any breakthrough you may take 20 mg of famotidine.  Follow a GERD diet:  Avoid fried, fatty, greasy, spicy, citrus foods. Avoid caffeine and carbonated beverages. Avoid chocolate. Try eating 4-6 small meals a day rather than 3 large meals. Do not eat within 3 hours of laying down. Prop head of bed up on wood or bricks to create a 6 inch incline.  We can consider exploring your gallbladder further if you continue to have epigastric burning as well as diarrhea after meals.  If still having bloating and diarrhea post procedures then we may treat you for possible IBS with a course of Xifaxan which is a gut specific antibiotic to help reset the bacteria in your small bowel.  It was a pleasure to see you today. I want to create trusting relationships with patients. If you receive a survey regarding your visit,  I greatly appreciate you taking time to fill this out on paper or through your MyChart. I value your feedback.  Charmaine Melia, MSN, FNP-BC, AGACNP-BC [facility]

## 2024-10-29 NOTE — Telephone Encounter (Signed)
 [employer] PA: CPT Code: GEEGD Description: EGD-esophagogastroduodenoscopy Case Number: [id number] Review Date: 10/29/2024 9:14:41 AM Expiration Date: N/A Status: Additional Clinical Uploaded On the Web. The prior authorization you submitted, Case [id number], has been received.

## 2024-10-30 NOTE — Telephone Encounter (Signed)
 [employer] PA:  Authorization Number: [id number] Start Date: 10/29/2024 Expiration Date: 12/13/2024

## 2024-12-09 ENCOUNTER — Telehealth: Payer: Self-pay | Admitting: *Deleted

## 2024-12-09 NOTE — Telephone Encounter (Signed)
 Spoke with pt. He wanted to reschedule with Dr. Cindie and moved to 2/11. Aware will send new instructions.   PA submitted via [employer] pending review Case [id number]

## 2024-12-09 NOTE — Telephone Encounter (Signed)
 [place] approved via [employer] Authorization Number: [id number] Case Number: [id number] DOS: 12/08/2024-06/07/2025

## 2024-12-24 ENCOUNTER — Ambulatory Visit (HOSPITAL_COMMUNITY): Admission: RE | Admit: 2024-12-24 | Source: Home / Self Care | Admitting: Internal Medicine

## 2024-12-24 ENCOUNTER — Encounter (HOSPITAL_COMMUNITY): Admission: RE | Payer: Self-pay | Source: Home / Self Care

## 2025-02-16 ENCOUNTER — Encounter: Payer: Self-pay | Admitting: Family Medicine
